# Patient Record
Sex: Female | Born: 1980 | Race: White | Hispanic: No | Marital: Married | Smoking: Never smoker
Health system: Southern US, Community
[De-identification: ages and names within clinical notes are randomized; demographics above are authoritative.]

## PROBLEM LIST (undated history)

## (undated) DIAGNOSIS — F32A Depression, unspecified: Secondary | ICD-10-CM

## (undated) DIAGNOSIS — R12 Heartburn: Secondary | ICD-10-CM

## (undated) DIAGNOSIS — O26899 Other specified pregnancy related conditions, unspecified trimester: Secondary | ICD-10-CM

## (undated) DIAGNOSIS — R51 Headache: Secondary | ICD-10-CM

## (undated) DIAGNOSIS — F329 Major depressive disorder, single episode, unspecified: Secondary | ICD-10-CM

---

## 2003-04-23 ENCOUNTER — Ambulatory Visit (HOSPITAL_COMMUNITY): Admission: RE | Admit: 2003-04-23 | Discharge: 2003-04-23 | Payer: Self-pay | Admitting: Sports Medicine

## 2003-04-23 ENCOUNTER — Encounter: Payer: Self-pay | Admitting: Sports Medicine

## 2004-06-10 ENCOUNTER — Other Ambulatory Visit: Admission: RE | Admit: 2004-06-10 | Discharge: 2004-06-10 | Payer: Self-pay | Admitting: Obstetrics and Gynecology

## 2005-07-30 ENCOUNTER — Other Ambulatory Visit: Admission: RE | Admit: 2005-07-30 | Discharge: 2005-07-30 | Payer: Self-pay | Admitting: Obstetrics and Gynecology

## 2008-04-26 ENCOUNTER — Inpatient Hospital Stay (HOSPITAL_COMMUNITY): Admission: AD | Admit: 2008-04-26 | Discharge: 2008-04-26 | Payer: Self-pay | Admitting: Obstetrics and Gynecology

## 2008-04-29 ENCOUNTER — Inpatient Hospital Stay (HOSPITAL_COMMUNITY): Admission: AD | Admit: 2008-04-29 | Discharge: 2008-04-30 | Payer: Self-pay | Admitting: Obstetrics & Gynecology

## 2008-04-29 ENCOUNTER — Inpatient Hospital Stay (HOSPITAL_COMMUNITY): Admission: AD | Admit: 2008-04-29 | Discharge: 2008-04-29 | Payer: Self-pay | Admitting: Obstetrics & Gynecology

## 2008-05-11 ENCOUNTER — Inpatient Hospital Stay (HOSPITAL_COMMUNITY): Admission: AD | Admit: 2008-05-11 | Discharge: 2008-05-15 | Payer: Self-pay | Admitting: Obstetrics and Gynecology

## 2008-09-05 ENCOUNTER — Encounter: Admission: RE | Admit: 2008-09-05 | Discharge: 2008-09-05 | Payer: Self-pay | Admitting: Obstetrics and Gynecology

## 2011-03-25 NOTE — Op Note (Signed)
NAMEBERYL, Carly Thomas               ACCOUNT NO.:  [id number]   MEDICAL RECORD NO.:  [id number]          PATIENT TYPE:  INP   LOCATION:  9126                          FACILITY:  [facility]   PHYSICIAN:  Kendra H. Tenny Craw, MD     DATE OF BIRTH:  1981-06-22   DATE OF PROCEDURE:  05/12/2008  DATE OF DISCHARGE:                               OPERATIVE REPORT   PREOPERATIVE DIAGNOSES:  1. Intrauterine pregnancy, 38 and 5 week.  2. Failure to descend.   POSTOPERATIVE DIAGNOSES:  1. Intrauterine pregnancy, 38 and 5 week.  2. Failure to descend.   PROCEDURE:  Primary low-transverse cesarean section via Pfannenstiel  skin incision.   SURGEON:  Freddrick March. Tenny Craw, MD   ASSISTANTDrinda Butts, surgical tech.   ANESTHESIA:  Epidural.   OPERATIVE FINDINGS:  Vigorous female infant in the vertex LOA  presentation weighing 8 pounds 2 ounces with Apgar scores of 9 at 1 and  9 at 5 minutes.  Normal-appearing ovaries and tubes.   SPECIMENS:  Placenta to L&D for disposal.   ESTIMATED BLOOD LOSS:  900 mL.   COMPLICATIONS:  None.   INDICATIONS:  Carly Thomas is a 30 year old G1, P0 who was admitted on  May 11, 2008, with spontaneous rupture of membranes.  At that time, she  was approximately fingertip to 1 cm dilated.  She did receive Pitocin  for labor augmentation.  At 6 o'clock this morning, she was found to be  8-9 cm dilated, completely effaced, but 0 to -1 station.  Upon  reevaluation at 9 o'clock, she was still approximately 8-cm dilated and  an intrauterine pressure catheter was placed at this time.  Contractions  were felt to be inadequate and Pitocin was titrated for adequate  contractions.  She made it to complete at approximately 1 o'clock this  afternoon and pushed for approximately an hour and 15 minutes; however,  there was no descent of the fetal head from the 0 to -1 station with an  hour of pushing.  The pubic arch was palpably very narrow and the fetal  head had not descended beneath the  pubic arch and there was concern for  failure of this event of the fetal head secondary to the inadequate  pelvic outlet.  This was discussed with the patient and she was offered  to either continue to push for another hour versus proceeding with  cesarean section.  She elected to proceed with C-section.  Following the  appropriate informed consent.   PROCEDURE:  The patient was brought to the operating room where epidural  anesthesia was found to be adequate.  She was placed in the dorsal  supine position in a leftward tilt, prepped and draped in the normal  sterile fashion.  Scalpel was used to make a Pfannenstiel skin incision,  which was carried down through the underlying layers of soft tissue to  the fascia.  Fascia was incised and the midline fascial incision was  extended laterally with Mayo scissors.  Superior aspect of the fascial  incision was grasped with Kocher clamps x2, tented up the underlying  rectus muscle was dissected off sharply with electrocautery unit.  The  same procedure was repeated on the inferior aspect of the fascial  incision and the rectus muscles were then separated in the midline.  The  abdominal peritoneum was identified, tented up, entered sharply with the  Metzenbaum.  The incision was extended superiorly and inferiorly with  good visualization of the bladder.  Bladder blade was inserted.  Vesicouterine peritoneum was tented up, entered sharply, and the  incision was extended laterally with the Metzenbaum and the bladder flap  was created digitally.  Bladder blade was then reinserted.  Scalpel was  used to make a low-transverse incision on the uterus, which was then  extended with blunt dissection.  Bandage scissors were used to extend  the uterine incision on the left hand aspect of the uterus.  The fetal  vertex was identified deep in the pelvis, brought up to the uterine  incision and the infant's head was easily delivered followed by the  body.   Infant cried vigorously on the operative field.  Cord was clamped  and cut.  The infant was passed to the waiting pediatricians.  Placenta  was then manually removed.  The uterus was exteriorized, cleared of all  clot and debris.  Uterine incision was repaired with #1 chromic in a  running locked fashion.  A second imbricating layer was performed.  Several additional figure-of-eight sutures were required to achieve  complete hemostasis of the uterine incision.  Ovaries and tubes were  inspected and found to be normal.  The uterus was then returned to the  abdominal cavity.  The abdominal cavity was cleared of all clot and  debris.  Uterine incision was reinspected and again confirmed to be  hemostatic.  The abdominal peritoneum was then reapproximated with 2-0  Vicryl and the rectus muscles were reapproximated with 2 figure-of-eight  sutures of #1 chromic and the fascia was closed with a 0 looped PDS and  skin was closed with staples.  All sponge, lap, needle counts were  correct x2.  The patient tolerated the procedure well and was brought to  the recovery room in stable condition following the procedure.      Freddrick March. Tenny Craw, MD  Electronically Signed     KHR/MEDQ  D:  05/12/2008  T:  05/13/2008  Job:  [id number]

## 2011-03-25 NOTE — Discharge Summary (Signed)
Carly Thomas, Carly Thomas               ACCOUNT NO.:  [id number]   MEDICAL RECORD NO.:  [id number]          PATIENT TYPE:  INP   LOCATION:  9126                          FACILITY:  [facility]   PHYSICIAN:  Gerrit Friends. Aldona Bar, M.D.   DATE OF BIRTH:  1981-06-29   DATE OF ADMISSION:  05/11/2008  DATE OF DISCHARGE:  05/15/2008                               DISCHARGE SUMMARY   DISCHARGE DIAGNOSES:  1. Term pregnancy, delivered 8-pound 2-ounce female infant, Apgars 9      and 9.  2. Blood type O positive.  3. Failure to progress.   PROCEDURE:  Primary low-transverse cesarean section.   SUMMARY:  This 30 year old gravida 2, para 0 was admitted in labor on  the evening of July 2.  She was followed expectantly, requested and  received an epidural, and during the day on July 3 was noted to have  failure of descent in the second stage of labor and was taken to the  operating room where she underwent a primary low-transverse cesarean  section with delivery of an 8-pound 2-ounce female infant with Apgars of  9 and 9.  Postoperative course was benign.  She was breast-feeding.  Discharge hemoglobin 8.8 with a white count of 14,700 and the platelet  count of 126,000.  On the morning of July 6, she was afebrile, having  normal bowel and bladder function.  Her wound was clean and dry.  She  was breast-feeding without difficulty.  Vital signs were stable, and she  was desirous of discharge.  Accordingly, her staples were removed and  wound was Steri-Stripped with Benzoin.  She was given all appropriate  instructions per discharge, brochure, and understood all instructions  well.   DISCHARGE MEDICATIONS:  1. Vitamins - 1 a day.  2. Feosol capsules - 1 daily.  3. Motrin 600 mg every 6 hours as needed for cramping or pain.  4. Tylox 1-2 for 4-6 hours as needed for more severe pain.   FOLLOWUP:  She will return to the office for followup in approximately 4  weeks' time or as needed.   CONDITION ON DISCHARGE:   Improves.      Gerrit Friends. Aldona Bar, M.D.  Electronically Signed     RMW/MEDQ  D:  05/15/2008  T:  05/15/2008  Job:  [id number]

## 2011-08-07 LAB — CBC
HCT: 25.4 — ABNORMAL LOW
Hemoglobin: 8.8 — ABNORMAL LOW
MCHC: 34.6
MCV: 92.3
MCV: 93
Platelets: 190
RBC: 2.73 — ABNORMAL LOW
WBC: 10.9 — ABNORMAL HIGH

## 2011-08-07 LAB — URINALYSIS, ROUTINE W REFLEX MICROSCOPIC
Bilirubin Urine: NEGATIVE
Glucose, UA: NEGATIVE
Hgb urine dipstick: NEGATIVE
Ketones, ur: NEGATIVE
Urobilinogen, UA: 0.2

## 2011-08-07 LAB — RPR: RPR Ser Ql: NONREACTIVE

## 2011-09-16 ENCOUNTER — Other Ambulatory Visit: Payer: Self-pay

## 2011-09-16 LAB — OB RESULTS CONSOLE HIV ANTIBODY (ROUTINE TESTING): HIV: NONREACTIVE

## 2011-09-16 LAB — OB RESULTS CONSOLE ANTIBODY SCREEN: Antibody Screen: NEGATIVE

## 2011-09-16 LAB — OB RESULTS CONSOLE RUBELLA ANTIBODY, IGM: Rubella: IMMUNE

## 2011-09-16 LAB — OB RESULTS CONSOLE ABO/RH: RH Type: POSITIVE

## 2011-09-22 ENCOUNTER — Other Ambulatory Visit (HOSPITAL_COMMUNITY): Payer: Self-pay | Admitting: Obstetrics & Gynecology

## 2011-09-22 DIAGNOSIS — O289 Unspecified abnormal findings on antenatal screening of mother: Secondary | ICD-10-CM

## 2011-09-25 ENCOUNTER — Encounter (HOSPITAL_COMMUNITY): Payer: Self-pay

## 2011-09-25 ENCOUNTER — Ambulatory Visit (HOSPITAL_COMMUNITY): Payer: Self-pay

## 2011-09-26 ENCOUNTER — Ambulatory Visit (HOSPITAL_COMMUNITY)
Admission: RE | Admit: 2011-09-26 | Discharge: 2011-09-26 | Disposition: A | Discharge status: Home / Self Care | Payer: BC Managed Care – PPO | Source: Ambulatory Visit | Attending: Obstetrics & Gynecology | Admitting: Obstetrics & Gynecology

## 2011-09-26 ENCOUNTER — Encounter (HOSPITAL_COMMUNITY): Payer: Self-pay

## 2011-09-26 ENCOUNTER — Other Ambulatory Visit (HOSPITAL_COMMUNITY): Payer: Self-pay | Admitting: Obstetrics & Gynecology

## 2011-09-26 VITALS — BP 114/48 | HR 77 | Wt 155.0 lb

## 2011-09-26 DIAGNOSIS — O3510X0 Maternal care for (suspected) chromosomal abnormality in fetus, unspecified, not applicable or unspecified: Secondary | ICD-10-CM | POA: Insufficient documentation

## 2011-09-26 DIAGNOSIS — O289 Unspecified abnormal findings on antenatal screening of mother: Secondary | ICD-10-CM | POA: Insufficient documentation

## 2011-09-26 DIAGNOSIS — O351XX0 Maternal care for (suspected) chromosomal abnormality in fetus, not applicable or unspecified: Secondary | ICD-10-CM | POA: Insufficient documentation

## 2011-09-26 DIAGNOSIS — Z3689 Encounter for other specified antenatal screening: Secondary | ICD-10-CM | POA: Insufficient documentation

## 2011-09-26 NOTE — Progress Notes (Signed)
Carly Thomas was seen for ultrasound appointment today.  Please see [facility] report for details.

## 2011-09-29 ENCOUNTER — Encounter (HOSPITAL_COMMUNITY): Payer: Self-pay

## 2011-09-29 DIAGNOSIS — O289 Unspecified abnormal findings on antenatal screening of mother: Secondary | ICD-10-CM | POA: Insufficient documentation

## 2011-09-29 DIAGNOSIS — Z141 Cystic fibrosis carrier: Secondary | ICD-10-CM | POA: Insufficient documentation

## 2011-09-29 NOTE — Progress Notes (Signed)
Genetic Counseling  High-Risk Gestation Note  Appointment Date:  09/26/2011 Referred By: Carly Beaver, MD Date of Birth:  1981/06/09 Partner:  Carly Thomas  Pregnancy History: [id number] Estimated Date of Delivery: 03/26/12 Estimated Gestational Age: [redacted]w[redacted]d Attending: Berna Spare, MD  [name] and her husband, [name], were seen for genetic counseling because of an increased risk for fetal Down syndrome based on First Trimester screen performed through [facility].  They were counseled regarding the First trimester screen and the associated 1 in 106 risk for fetal Down syndrome.  We reviewed chromosomes, nondisjunction, and the common features and variable prognosis of Down syndrome.  In addition, we reviewed the screen adjusted reduction in risk for trisomy 40 (1 in 1267).  We also discussed other explanations for a screen positive result including: differences in maternal metabolism, and normal variation.  We reviewed other available screening and diagnostic options including detailed ultrasound and amniocentesis. We discussed the risks, limitations, and benefits of each. We discussed another type of screening test which utilizes fetal DNA which is found in the maternal circulation. This test is not diagnostic for chromosome conditions, but can provide information regarding the presence or absence of extra fetal DNA for chromosomes 13, 18 and 21. The reported detection rate for Trisomy 21, and Trisomy 18 is greater than 99%, and is approximately 91% for Trisomy 13. The false positive rate is thought to be less than 1% for any of these conditions. After consideration of all the options, and a clear understanding of the newness and limitations of the cell free DNA testing, they elected to proceed with cell free DNA testing. Those results will be available in 8-10 days and will be forwarded to your office when we receive them. [name] will consider the option of amniocentesis  pending the results of this screening test. The couple also elected to return on 10/29/11 for detailed ultrasound. They understand that ultrasound cannot rule out all birth defects or genetic syndromes.  However, they were counseled that 50-80% of fetuses with Down syndrome, when well visualized, have detectable anomalies or soft markers by ultrasound.  A complete ultrasound was performed today.  The ultrasound report will be sent under separate cover.  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 [name] reportedly has had negative CF carrier screening. We currently do not have medical records to confirm this report. We discussed cystic fibrosis (CF) including: the features of CF, the incidence in the Caucasian population, autosomal recessive inheritance, and the 25% chance of having a baby with CF if both parents are carriers of CF.  We reviewed that carrier screening does not identify all carriers. A negative CF screen for [name] reduces but does not eliminate his chance to be a carrier. Prenatal diagnosis for CF is most informative when mutations have been in both parents first. We reviewed that CF is included on the newborn screening panel in .   [name] reported a nephew with DiGeorge syndrome who passed away at age 57 months. He reported that his brother (his nephew's father) had follow-up testing which indicated that he did not carry the chromosome difference. However, he was described as having "backwards chromosomes." Approximately 93% of individuals with DiGeorge syndrome have a de novo (occurring for the first time in that individual) deletion of the 22q11.2 region, and approximately 7% of individuals inherited the deletion from one parent who also carries  the deletion of the 22q11.2 region. DiGeorge syndrome is an autosomal dominant condition, meaning once a person has the condition they have a 50% (1 in 2)  chance with each pregnancy to pass on the chromosome 22 that has the deletion and also have a child with the condition. It is unclear if [name] brother had chromosome analysis that revealed a chromosome rearrangement that may increase recurrence risk for DiGeorge or not. If so, then chromosome analysis would be available to relatives, such as [name] to assess for an underlying chromosome rearrangement in order to assess recurrence risk. However, in the case that DiGeorge occurred sporadically, recurrence risk for the current pregnancy would not be expected to be increased. We are available to review medical records, if desired, regarding the result of chromosome testing performed in order to more accurately assess recurrence risk for relatives.   Additionally, the patient reported a maternal aunt with open spina bifida, which was surgically corrected at birth. She is currently 32 years old. No additional relatives were reported with spina bifida, including this relative's children. We discussed that spina bifida is a common birth differences and affects approximately 1 in 1000 live births. NTDs occur as an isolated finding, in the majority of cases.  Isolated NTDs are usually inherited in a multifactorial manner in which there is no prior family history.  Multifactorial conditions have both environmental and genetic factors that contribute to their development.  We also discussed that NTDs may occur as a feature of an underlying genetic syndrome or condition.  Approximately 5-10% of individuals who have spina bifida also have an underlying chromosome condition.  We reviewed chromosomes, genes, and various inheritance patterns.  Without additional information, it is difficult to determine a specific etiology.  If the relative had a nonsyndromic, multifactorial NTD, the risk of recurrence for the current pregnancy (a third degree relative) is estimated to be 0.5%.  We reviewed available screening and  testing options including targeted ultrasound, MSAFP, and amniocentesis. Additional information regarding etiology for this relative may alter recurrence risk assessment. Further genetic counseling is warranted if more information is obtained.  [name] also reported a maternal aunt with mild mental retardation. An underlying cause is not known. We discussed that there are many different causes of mental retardation such as genetic differences, sporadic causes, or injuries.  A specific diagnosis for mental retardation can be determined in approximately 50% of cases.  In the remaining 50% of cases, a diagnosis may not ever be determined.  Without more specific information, potential genetic risks cannot be determined.  The patient may call if she is able to find out more information regarding a specific diagnosis.      [name] denied exposure to environmental toxins or chemical agents. She denied the use of alcohol, tobacco or street drugs. She denied significant viral illnesses during the course of her pregnancy. Her medical and surgical histories were noncontributory.   I counseled this couple for approximately 40 minutes regarding the above risks and available options.     Carly Braun Zora Glendenning, MS, [facility] 09/29/2011

## 2011-09-30 ENCOUNTER — Other Ambulatory Visit: Payer: Self-pay

## 2011-10-07 ENCOUNTER — Telehealth (HOSPITAL_COMMUNITY): Payer: Self-pay | Admitting: MS"

## 2011-10-07 NOTE — Telephone Encounter (Signed)
  Results of cell free fetal DNA (cffDNA) testing through [facility]) indicate a low risk (less than 1 in 10,000 risk) for Trisomy 21, Trisomy 18, and Trisomy 13. These results were communicated to [name]. The reported detection rate for Trisomy 21, and Trisomy 18 is greater than 99%, and is approximately 91% for Trisomy 13. The false positive rate is thought to be less than 1% for any of these conditions. The patient understands that this test is not diagnostic. She is happy to hear these results. Targeted ultrasound is scheduled for 10/29/11.

## 2011-10-10 ENCOUNTER — Other Ambulatory Visit: Payer: Self-pay

## 2011-10-29 ENCOUNTER — Other Ambulatory Visit (HOSPITAL_COMMUNITY): Payer: Self-pay | Admitting: Obstetrics & Gynecology

## 2011-10-29 ENCOUNTER — Ambulatory Visit (HOSPITAL_COMMUNITY)
Admission: RE | Admit: 2011-10-29 | Discharge: 2011-10-29 | Disposition: A | Discharge status: Home / Self Care | Payer: BC Managed Care – PPO | Source: Ambulatory Visit | Attending: Obstetrics & Gynecology | Admitting: Obstetrics & Gynecology

## 2011-10-29 DIAGNOSIS — O351XX0 Maternal care for (suspected) chromosomal abnormality in fetus, not applicable or unspecified: Secondary | ICD-10-CM | POA: Insufficient documentation

## 2011-10-29 DIAGNOSIS — O3510X0 Maternal care for (suspected) chromosomal abnormality in fetus, unspecified, not applicable or unspecified: Secondary | ICD-10-CM | POA: Insufficient documentation

## 2011-10-29 DIAGNOSIS — O352XX0 Maternal care for (suspected) hereditary disease in fetus, not applicable or unspecified: Secondary | ICD-10-CM

## 2011-10-29 DIAGNOSIS — O289 Unspecified abnormal findings on antenatal screening of mother: Secondary | ICD-10-CM

## 2011-10-29 NOTE — Progress Notes (Signed)
Ms. Tolan was seen for ultrasound appointment today.  Please see [facility] report for details.  

## 2011-12-03 ENCOUNTER — Ambulatory Visit (HOSPITAL_COMMUNITY): Payer: BC Managed Care – PPO

## 2012-02-27 ENCOUNTER — Encounter (HOSPITAL_COMMUNITY): Payer: Self-pay

## 2012-03-12 ENCOUNTER — Encounter (HOSPITAL_COMMUNITY)
Admission: RE | Admit: 2012-03-12 | Discharge: 2012-03-12 | Disposition: A | Discharge status: Home / Self Care | Payer: BC Managed Care – PPO | Source: Ambulatory Visit | Attending: Obstetrics and Gynecology | Admitting: Obstetrics and Gynecology

## 2012-03-12 ENCOUNTER — Encounter (HOSPITAL_COMMUNITY): Payer: Self-pay

## 2012-03-12 HISTORY — DX: Headache: R51

## 2012-03-12 HISTORY — DX: Other specified pregnancy related conditions, unspecified trimester: O26.899

## 2012-03-12 HISTORY — DX: Heartburn: R12

## 2012-03-12 HISTORY — DX: Depression, unspecified: F32.A

## 2012-03-12 HISTORY — DX: Major depressive disorder, single episode, unspecified: F32.9

## 2012-03-12 LAB — SURGICAL PCR SCREEN: MRSA, PCR: NEGATIVE

## 2012-03-12 LAB — CBC
HCT: 35.1 % — ABNORMAL LOW (ref 36.0–46.0)
Hemoglobin: 11.6 g/dL — ABNORMAL LOW (ref 12.0–15.0)
MCH: 31 pg (ref 26.0–34.0)
MCHC: 33 g/dL (ref 30.0–36.0)
MCV: 93.9 fL (ref 78.0–100.0)

## 2012-03-12 NOTE — Patient Instructions (Signed)
YOUR PROCEDURE IS SCHEDULED ON:03/19/12  ENTER THROUGH THE MAIN ENTRANCE OF [facility] AT:1030am  USE DESK PHONE AND DIAL [phone] TO INFORM us OF YOUR ARRIVAL  CALL [phone] IF YOU HAVE ANY QUESTIONS OR PROBLEMS PRIOR TO YOUR ARRIVAL.  REMEMBER: DO NOT EAT AFTER MIDNIGHT :Thursday  SPECIAL INSTRUCTIONS:Clear liquids until 8am Fri   YOU MAY BRUSH YOUR TEETH THE MORNING OF SURGERY   TAKE THESE MEDICINES THE DAY OF SURGERY WITH SIP OF WATER:none   DO NOT WEAR JEWELRY, EYE MAKEUP, LIPSTICK OR DARK FINGERNAIL POLISH DO NOT WEAR LOTIONS  DO NOT SHAVE FOR 48 HOURS PRIOR TO SURGERY  YOU WILL NOT BE ALLOWED TO DRIVE YOURSELF HOME.  NAME OF DRIVER:Nick Noralyn Pick

## 2012-03-15 ENCOUNTER — Encounter (HOSPITAL_COMMUNITY): Payer: Self-pay

## 2012-03-18 MED ORDER — CEFAZOLIN SODIUM-DEXTROSE 2-3 GM-% IV SOLR
2.0000 g | INTRAVENOUS | Status: AC
  Administered 2012-03-19: 2 g via INTRAVENOUS
  Filled 2012-03-18: qty 50

## 2012-03-19 ENCOUNTER — Encounter (HOSPITAL_COMMUNITY): Payer: Self-pay | Admitting: *Deleted

## 2012-03-19 ENCOUNTER — Inpatient Hospital Stay (HOSPITAL_COMMUNITY)
Admission: RE | Admit: 2012-03-19 | Discharge: 2012-03-21 | DRG: 371 | Disposition: A | Discharge status: Home / Self Care | Payer: BC Managed Care – PPO | Source: Ambulatory Visit | Attending: Obstetrics and Gynecology | Admitting: Obstetrics and Gynecology

## 2012-03-19 ENCOUNTER — Encounter (HOSPITAL_COMMUNITY)
Admission: RE | Disposition: A | Discharge status: Home / Self Care | Payer: Self-pay | Source: Ambulatory Visit | Attending: Obstetrics and Gynecology

## 2012-03-19 ENCOUNTER — Encounter (HOSPITAL_COMMUNITY): Payer: Self-pay

## 2012-03-19 ENCOUNTER — Inpatient Hospital Stay (HOSPITAL_COMMUNITY): Payer: BC Managed Care – PPO

## 2012-03-19 ENCOUNTER — Encounter (HOSPITAL_COMMUNITY): Payer: Self-pay | Admitting: Registered Nurse

## 2012-03-19 DIAGNOSIS — Z141 Cystic fibrosis carrier: Secondary | ICD-10-CM

## 2012-03-19 DIAGNOSIS — O289 Unspecified abnormal findings on antenatal screening of mother: Secondary | ICD-10-CM

## 2012-03-19 DIAGNOSIS — O34219 Maternal care for unspecified type scar from previous cesarean delivery: Principal | ICD-10-CM | POA: Diagnosis present

## 2012-03-19 DIAGNOSIS — Z3480 Encounter for supervision of other normal pregnancy, unspecified trimester: Secondary | ICD-10-CM

## 2012-03-19 SURGERY — Surgical Case
Anesthesia: Spinal | Wound class: Clean Contaminated

## 2012-03-19 MED ORDER — ONDANSETRON HCL 4 MG/2ML IJ SOLN
INTRAMUSCULAR | Status: AC
  Filled 2012-03-19: qty 2

## 2012-03-19 MED ORDER — NALBUPHINE HCL 10 MG/ML IJ SOLN
5.0000 mg | INTRAMUSCULAR | Status: DC | PRN
  Filled 2012-03-19: qty 1

## 2012-03-19 MED ORDER — MEPERIDINE HCL 25 MG/ML IJ SOLN: 6.2500 mg | INTRAMUSCULAR | Status: DC | PRN

## 2012-03-19 MED ORDER — HYDROMORPHONE BOLUS VIA INFUSION: 1.0000 mg | Freq: Once | INTRAVENOUS | Status: DC

## 2012-03-19 MED ORDER — MORPHINE SULFATE (PF) 0.5 MG/ML IJ SOLN
INTRAMUSCULAR | Status: DC | PRN
  Administered 2012-03-19: .1 mg via INTRATHECAL

## 2012-03-19 MED ORDER — PHENYLEPHRINE HCL 10 MG/ML IJ SOLN
INTRAMUSCULAR | Status: DC | PRN
  Administered 2012-03-19: 80 ug via INTRAVENOUS
  Administered 2012-03-19: 40 ug via INTRAVENOUS

## 2012-03-19 MED ORDER — MORPHINE SULFATE 0.5 MG/ML IJ SOLN
INTRAMUSCULAR | Status: AC
  Filled 2012-03-19: qty 10

## 2012-03-19 MED ORDER — LANOLIN HYDROUS EX OINT: 1.0000 "application " | TOPICAL_OINTMENT | CUTANEOUS | Status: DC | PRN

## 2012-03-19 MED ORDER — MIDAZOLAM HCL 2 MG/2ML IJ SOLN: 0.5000 mg | Freq: Once | INTRAMUSCULAR | Status: DC | PRN

## 2012-03-19 MED ORDER — SCOPOLAMINE 1 MG/3DAYS TD PT72
MEDICATED_PATCH | TRANSDERMAL | Status: AC
  Administered 2012-03-19: 1.5 mg via TRANSDERMAL
  Filled 2012-03-19: qty 1

## 2012-03-19 MED ORDER — KETOROLAC TROMETHAMINE 30 MG/ML IJ SOLN
INTRAMUSCULAR | Status: AC
  Administered 2012-03-19: 30 mg via INTRAMUSCULAR
  Filled 2012-03-19: qty 1

## 2012-03-19 MED ORDER — ACETAMINOPHEN 10 MG/ML IV SOLN
1000.0000 mg | Freq: Four times a day (QID) | INTRAVENOUS | Status: AC | PRN
  Filled 2012-03-19: qty 100

## 2012-03-19 MED ORDER — SIMETHICONE 80 MG PO CHEW: 80.0000 mg | CHEWABLE_TABLET | ORAL | Status: DC | PRN

## 2012-03-19 MED ORDER — DIPHENHYDRAMINE HCL 25 MG PO CAPS
25.0000 mg | ORAL_CAPSULE | ORAL | Status: DC | PRN
  Filled 2012-03-19 (×3): qty 1

## 2012-03-19 MED ORDER — SIMETHICONE 80 MG PO CHEW
80.0000 mg | CHEWABLE_TABLET | Freq: Three times a day (TID) | ORAL | Status: DC
  Administered 2012-03-19 – 2012-03-21 (×6): 80 mg via ORAL

## 2012-03-19 MED ORDER — KETOROLAC TROMETHAMINE 30 MG/ML IJ SOLN: 30.0000 mg | Freq: Four times a day (QID) | INTRAMUSCULAR | Status: AC | PRN

## 2012-03-19 MED ORDER — FENTANYL CITRATE 0.05 MG/ML IJ SOLN
INTRAMUSCULAR | Status: AC
  Filled 2012-03-19: qty 2

## 2012-03-19 MED ORDER — IBUPROFEN 600 MG PO TABS
600.0000 mg | ORAL_TABLET | Freq: Four times a day (QID) | ORAL | Status: DC
  Administered 2012-03-20 – 2012-03-21 (×6): 600 mg via ORAL
  Filled 2012-03-19 (×6): qty 1

## 2012-03-19 MED ORDER — TETANUS-DIPHTH-ACELL PERTUSSIS 5-2.5-18.5 LF-MCG/0.5 IM SUSP
0.5000 mL | Freq: Once | INTRAMUSCULAR | Status: AC
  Administered 2012-03-20: 0.5 mL via INTRAMUSCULAR
  Filled 2012-03-19: qty 0.5

## 2012-03-19 MED ORDER — FENTANYL CITRATE 0.05 MG/ML IJ SOLN: 25.0000 ug | INTRAMUSCULAR | Status: DC | PRN

## 2012-03-19 MED ORDER — ONDANSETRON HCL 4 MG/2ML IJ SOLN: 4.0000 mg | Freq: Three times a day (TID) | INTRAMUSCULAR | Status: DC | PRN

## 2012-03-19 MED ORDER — OXYTOCIN 10 UNIT/ML IJ SOLN
INTRAMUSCULAR | Status: DC | PRN
  Administered 2012-03-19: 20 [IU]

## 2012-03-19 MED ORDER — 0.9 % SODIUM CHLORIDE (POUR BTL) OPTIME
TOPICAL | Status: DC | PRN
  Administered 2012-03-19: 1000 mL

## 2012-03-19 MED ORDER — METOCLOPRAMIDE HCL 5 MG/ML IJ SOLN: 10.0000 mg | Freq: Three times a day (TID) | INTRAMUSCULAR | Status: DC | PRN

## 2012-03-19 MED ORDER — DIBUCAINE 1 % RE OINT: 1.0000 "application " | TOPICAL_OINTMENT | RECTAL | Status: DC | PRN

## 2012-03-19 MED ORDER — HYDROMORPHONE HCL PF 1 MG/ML IJ SOLN
1.0000 mg | Freq: Once | INTRAMUSCULAR | Status: AC
  Administered 2012-03-19: 1 mg via INTRAVENOUS
  Filled 2012-03-19: qty 1

## 2012-03-19 MED ORDER — KETOROLAC TROMETHAMINE 30 MG/ML IJ SOLN
30.0000 mg | Freq: Four times a day (QID) | INTRAMUSCULAR | Status: AC | PRN
  Administered 2012-03-19: 30 mg via INTRAMUSCULAR
  Filled 2012-03-19: qty 1

## 2012-03-19 MED ORDER — NALOXONE HCL 0.4 MG/ML IJ SOLN: 0.4000 mg | INTRAMUSCULAR | Status: DC | PRN

## 2012-03-19 MED ORDER — DIPHENHYDRAMINE HCL 25 MG PO CAPS
25.0000 mg | ORAL_CAPSULE | Freq: Four times a day (QID) | ORAL | Status: DC | PRN
  Administered 2012-03-20 (×3): 25 mg via ORAL
  Filled 2012-03-19: qty 1

## 2012-03-19 MED ORDER — ONDANSETRON HCL 4 MG/2ML IJ SOLN: 4.0000 mg | INTRAMUSCULAR | Status: DC | PRN

## 2012-03-19 MED ORDER — DIPHENHYDRAMINE HCL 50 MG/ML IJ SOLN: 12.5000 mg | INTRAMUSCULAR | Status: DC | PRN

## 2012-03-19 MED ORDER — SODIUM CHLORIDE 0.9 % IV SOLN
1.0000 ug/kg/h | INTRAVENOUS | Status: DC | PRN
  Filled 2012-03-19: qty 2.5

## 2012-03-19 MED ORDER — MENTHOL 3 MG MT LOZG: 1.0000 | LOZENGE | OROMUCOSAL | Status: DC | PRN

## 2012-03-19 MED ORDER — OXYTOCIN 10 UNIT/ML IJ SOLN
INTRAMUSCULAR | Status: AC
  Filled 2012-03-19: qty 2

## 2012-03-19 MED ORDER — KETOROLAC TROMETHAMINE 30 MG/ML IJ SOLN
30.0000 mg | Freq: Once | INTRAMUSCULAR | Status: AC
  Administered 2012-03-19: 30 mg via INTRAVENOUS

## 2012-03-19 MED ORDER — BUPIVACAINE IN DEXTROSE 0.75-8.25 % IT SOLN
INTRATHECAL | Status: DC | PRN
  Administered 2012-03-19: 1.5 mL via INTRATHECAL

## 2012-03-19 MED ORDER — ONDANSETRON HCL 4 MG PO TABS: 4.0000 mg | ORAL_TABLET | ORAL | Status: DC | PRN

## 2012-03-19 MED ORDER — DIPHENHYDRAMINE HCL 50 MG/ML IJ SOLN: 25.0000 mg | INTRAMUSCULAR | Status: DC | PRN

## 2012-03-19 MED ORDER — LACTATED RINGERS IV SOLN
INTRAVENOUS | Status: DC
  Administered 2012-03-19 (×3): via INTRAVENOUS

## 2012-03-19 MED ORDER — WITCH HAZEL-GLYCERIN EX PADS: 1.0000 "application " | MEDICATED_PAD | CUTANEOUS | Status: DC | PRN

## 2012-03-19 MED ORDER — OXYTOCIN 20 UNITS IN LACTATED RINGERS INFUSION - SIMPLE
INTRAVENOUS | Status: AC
  Administered 2012-03-19: 125 mL/h via INTRAVENOUS
  Filled 2012-03-19: qty 1000

## 2012-03-19 MED ORDER — SCOPOLAMINE 1 MG/3DAYS TD PT72
1.0000 | MEDICATED_PATCH | Freq: Once | TRANSDERMAL | Status: DC
  Administered 2012-03-19: 1.5 mg via TRANSDERMAL

## 2012-03-19 MED ORDER — ONDANSETRON HCL 4 MG/2ML IJ SOLN
INTRAMUSCULAR | Status: DC | PRN
  Administered 2012-03-19: 4 mg via INTRAVENOUS

## 2012-03-19 MED ORDER — SENNOSIDES-DOCUSATE SODIUM 8.6-50 MG PO TABS
2.0000 | ORAL_TABLET | Freq: Every day | ORAL | Status: DC
  Administered 2012-03-19 – 2012-03-20 (×2): 2 via ORAL

## 2012-03-19 MED ORDER — PHENYLEPHRINE 40 MCG/ML (10ML) SYRINGE FOR IV PUSH (FOR BLOOD PRESSURE SUPPORT)
PREFILLED_SYRINGE | INTRAVENOUS | Status: AC
  Filled 2012-03-19: qty 5

## 2012-03-19 MED ORDER — LACTATED RINGERS IV SOLN: INTRAVENOUS | Status: DC

## 2012-03-19 MED ORDER — PRENATAL MULTIVITAMIN CH
1.0000 | ORAL_TABLET | Freq: Every day | ORAL | Status: DC
  Administered 2012-03-20 – 2012-03-21 (×2): 1 via ORAL
  Filled 2012-03-19 (×2): qty 1

## 2012-03-19 MED ORDER — OXYTOCIN 20 UNITS IN LACTATED RINGERS INFUSION - SIMPLE
125.0000 mL/h | INTRAVENOUS | Status: AC
  Administered 2012-03-19: 125 mL/h via INTRAVENOUS

## 2012-03-19 MED ORDER — PROMETHAZINE HCL 25 MG/ML IJ SOLN: 6.2500 mg | INTRAMUSCULAR | Status: DC | PRN

## 2012-03-19 MED ORDER — OXYCODONE-ACETAMINOPHEN 5-325 MG PO TABS
1.0000 | ORAL_TABLET | ORAL | Status: DC | PRN
  Administered 2012-03-20 (×8): 1 via ORAL
  Administered 2012-03-21 (×2): 2 via ORAL
  Filled 2012-03-19: qty 2
  Filled 2012-03-19 (×6): qty 1
  Filled 2012-03-19: qty 2
  Filled 2012-03-19 (×2): qty 1

## 2012-03-19 MED ORDER — ACETAMINOPHEN 325 MG PO TABS: 325.0000 mg | ORAL_TABLET | ORAL | Status: DC | PRN

## 2012-03-19 MED ORDER — ZOLPIDEM TARTRATE 5 MG PO TABS: 5.0000 mg | ORAL_TABLET | Freq: Every evening | ORAL | Status: DC | PRN

## 2012-03-19 MED ORDER — EPHEDRINE 5 MG/ML INJ
INTRAVENOUS | Status: AC
  Filled 2012-03-19: qty 10

## 2012-03-19 MED ORDER — EPHEDRINE SULFATE 50 MG/ML IJ SOLN
INTRAMUSCULAR | Status: DC | PRN
  Administered 2012-03-19: 5 mg via INTRAVENOUS
  Administered 2012-03-19 (×2): 10 mg via INTRAVENOUS

## 2012-03-19 MED ORDER — SODIUM CHLORIDE 0.9 % IJ SOLN: 3.0000 mL | INTRAMUSCULAR | Status: DC | PRN

## 2012-03-19 MED ORDER — FENTANYL CITRATE 0.05 MG/ML IJ SOLN
INTRAMUSCULAR | Status: DC | PRN
  Administered 2012-03-19: 25 ug via INTRATHECAL

## 2012-03-19 MED ORDER — SCOPOLAMINE 1 MG/3DAYS TD PT72: 1.0000 | MEDICATED_PATCH | Freq: Once | TRANSDERMAL | Status: DC

## 2012-03-19 SURGICAL SUPPLY — 35 items
ADH SKN CLS APL DERMABOND .7 (GAUZE/BANDAGES/DRESSINGS) ×2
CLOTH BEACON ORANGE TIMEOUT ST (SAFETY) ×2 IMPLANT
DERMABOND ADVANCED (GAUZE/BANDAGES/DRESSINGS) ×2
DERMABOND ADVANCED .7 DNX12 (GAUZE/BANDAGES/DRESSINGS) IMPLANT
DRESSING TELFA 8X3 (GAUZE/BANDAGES/DRESSINGS) ×2 IMPLANT
ELECT REM PT RETURN 9FT ADLT (ELECTROSURGICAL) ×2
ELECTRODE REM PT RTRN 9FT ADLT (ELECTROSURGICAL) ×1 IMPLANT
EXTRACTOR VACUUM M CUP 4 TUBE (SUCTIONS) ×1 IMPLANT
GAUZE SPONGE 4X4 12PLY STRL LF (GAUZE/BANDAGES/DRESSINGS) ×4 IMPLANT
GLOVE BIO SURGEON STRL SZ7 (GLOVE) ×4 IMPLANT
GLOVE BIOGEL PI IND STRL 6.5 (GLOVE) IMPLANT
GLOVE BIOGEL PI IND STRL 7.0 (GLOVE) IMPLANT
GLOVE BIOGEL PI INDICATOR 6.5 (GLOVE) ×1
GLOVE BIOGEL PI INDICATOR 7.0 (GLOVE) ×1
GLOVE ECLIPSE 6.0 STRL STRAW (GLOVE) ×1 IMPLANT
GLOVE ECLIPSE 6.5 STRL STRAW (GLOVE) ×1 IMPLANT
GOWN PREVENTION PLUS LG XLONG (DISPOSABLE) ×5 IMPLANT
KIT ABG SYR 3ML LUER SLIP (SYRINGE) IMPLANT
NDL HYPO 25X5/8 SAFETYGLIDE (NEEDLE) IMPLANT
NEEDLE HYPO 25X5/8 SAFETYGLIDE (NEEDLE) IMPLANT
NS IRRIG 1000ML POUR BTL (IV SOLUTION) ×2 IMPLANT
PACK C SECTION WH (CUSTOM PROCEDURE TRAY) ×2 IMPLANT
PAD ABD 7.5X8 STRL (GAUZE/BANDAGES/DRESSINGS) ×2 IMPLANT
RTRCTR C-SECT PINK 25CM LRG (MISCELLANEOUS) ×1 IMPLANT
RTRCTR C-SECT PINK 34CM XLRG (MISCELLANEOUS) IMPLANT
SLEEVE SCD COMPRESS KNEE MED (MISCELLANEOUS) IMPLANT
STAPLER VISISTAT 35W (STAPLE) IMPLANT
SUT CHROMIC 1 CTX 36 (SUTURE) ×4 IMPLANT
SUT PDS AB 0 CTX 60 (SUTURE) ×2 IMPLANT
SUT VIC AB 2-0 CT1 27 (SUTURE) ×4
SUT VIC AB 2-0 CT1 TAPERPNT 27 (SUTURE) ×1 IMPLANT
SUT VIC AB 4-0 KS 27 (SUTURE) ×1 IMPLANT
TOWEL OR 17X24 6PK STRL BLUE (TOWEL DISPOSABLE) ×4 IMPLANT
TRAY FOLEY CATH 14FR (SET/KITS/TRAYS/PACK) ×2 IMPLANT
WATER STERILE IRR 1000ML POUR (IV SOLUTION) ×2 IMPLANT

## 2012-03-19 NOTE — Preoperative (Signed)
Beta Blockers   Reason not to administer Beta Blockers:Not Applicable 

## 2012-03-19 NOTE — Op Note (Signed)
Pre-Operative Diagnosis: 1) 39+3 week intrauterine pregnancy 2) History of prior cesarean section, declines trial of labor Postoperative Diagnosis: Same Procedure: Repeat cesarean section via pfannensteil incision Surgeon: Dr. Waynard Reeds Assistant: Dr. Miguel Aschoff Operative Findings: Vigorous female infant in the vertex presentation with apgars of 9 & 9. Normal ovaries, tubes, and uterus.  Arcuate appearing uterus. Specimen: Placenta for disposal EBL: Total I/O In: 2600 [I.V.:2600] Out: 1200 [Urine:200; Blood:1000]   Procedure:Carly Thomas is an 31 year old gravida 3 para 1011 at 63 weeks and 3 days estimated gestational age who presents for cesarean section. Following the appropriate informed consent the patient was brought to the operating room where spinal anesthesia was administered and found to be adequate. She was placed in the dorsal supine position with a leftward tilt. She was prepped and draped in the normal sterile fashion. Scalpel was then used to make a Pfannenstiel skin incision which was carried down to the underlying layers of soft tissue to the fascia. The fascia was incised in the midline and the fascial incision was extended laterally with Mayo scissors. The superior aspect of the fascial incision was grasped with Coker clamps x2, tented up and the rectus muscles dissected off sharply with the electrocautery unit area and the same procedure was repeated on the inferior aspect of the fascial incision. The rectus muscles were separated in the midline. The abdominal peritoneum was identified, tented up, entered sharply, and the incision was extended superiorly and inferiorly with good visualization of the bladder. The a Alexis retractor was then deployed. The vesicouterine peritoneum was identified, tented up, entered sharply, and the bladder flap was created digitally. Scalpel was then used to make a low transverse incision on the uterus which was extended laterally with both blunt  dissection and the bandage scissors. The fetal vertex was identified, delivered easily through the uterine incision followed by the body. The infant was bulb suctioned on the operative field cried vigorously, corpus length and cut and the infant was passed to the waiting neonatologist. Placenta was then delivered spontaneously, the uterus was cleared of all clot and debris. The uterine incision was repaired with #1 chromic in running locked fashion followed by a second imbricating layer. Ovaries and tubes were inspected and normal. The Alexis retractor was removed. The uterus was returned to the abdominal cavity the abdominal cavity was cleared of all clot and debris. The abdominal peritoneum was reapproximated with 2-0 Vicryl in a running fashion, the rectus muscles was reapproximated with 2-0 vicryl in a running fashion. The fascia was closed with a looped PDS in a running fashion. The skin was closed with 4-0 vicryl in a subcuticular fashion and dermabond. All sponge lap and needle counts were correct x2. Patient tolerated the procedure well and recovered in stable condition following the procedure.

## 2012-03-19 NOTE — Anesthesia Preprocedure Evaluation (Signed)
Anesthesia Evaluation  Patient identified by MRN, date of birth, ID band Patient awake    Reviewed: Allergy & Precautions, H&P , NPO status , Patient's Chart, lab work & pertinent test results  Airway Mallampati: III      Dental No notable dental hx.    Pulmonary neg pulmonary ROS,  breath sounds clear to auscultation  Pulmonary exam normal       Cardiovascular Exercise Tolerance: Good negative cardio ROS  Rhythm:regular Rate:Normal     Neuro/Psych negative neurological ROS  negative psych ROS   GI/Hepatic negative GI ROS, Neg liver ROS,   Endo/Other  negative endocrine ROS  Renal/GU negative Renal ROS  negative genitourinary   Musculoskeletal   Abdominal Normal abdominal exam  (+)   Peds  Hematology negative hematology ROS (+)   Anesthesia Other Findings Heartburn in pregnancy     Headache   migraines with 1st pregnancy    Depression   with 1st pregnancy    Reproductive/Obstetrics (+) Pregnancy                           Anesthesia Physical Anesthesia Plan  ASA: II  Anesthesia Plan: Spinal   Post-op Pain Management:    Induction:   Airway Management Planned:   Additional Equipment:   Intra-op Plan:   Post-operative Plan:   Informed Consent: I have reviewed the patients History and Physical, chart, labs and discussed the procedure including the risks, benefits and alternatives for the proposed anesthesia with the patient or authorized representative who has indicated his/her understanding and acceptance.     Plan Discussed with: Anesthesiologist, CRNA and Surgeon  Anesthesia Plan Comments:         Anesthesia Quick Evaluation

## 2012-03-19 NOTE — Transfer of Care (Signed)
Immediate Anesthesia Transfer of Care Note  Patient: Carly Thomas  Procedure(s) Performed: Procedure(s) (LRB): CESAREAN SECTION (N/A)  Patient Location: PACU  Anesthesia Type: Spinal  Level of Consciousness: awake, alert  and oriented  Airway & Oxygen Therapy: Patient Spontanous Breathing  Post-op Assessment: Report given to PACU RN and Post -op Vital signs reviewed and stable  Post vital signs: stable  Complications: No apparent anesthesia complications

## 2012-03-19 NOTE — H&P (Signed)
  CC: Scheduled C/S HPI: 31 yo G3P1011 presents at 39+3 for a scheduled repeat cesarean section.  The patient had her first cesarean for arrest of descent at 38 weeks for a 8#2 ounce baby.  The EFW of this pregnancy is projected to be similar.  The patient had an abnormal first trimester screen  With a DSR of 1 in 106.  She was seen by MFM and had a normal level II ultrasound.  SHe had the Harmony test performed which was normal. PMH: None POB: G3P1011  2002 SAB  2009 38 wk primary c/s female 8#2 PGYN: No abn pap no STDs Meds: PNV All: NKDA SH: - x 3 PNL: see hollister PE: AFVSS AOX3 Gravid soft NT Cvx 1/50/-2 A/P 1) Repeat cesarean section

## 2012-03-19 NOTE — Brief Op Note (Signed)
03/19/2012  1:02 PM  PATIENT:  Carly Thomas  31 y.o. female  PRE-OPERATIVE DIAGNOSIS:  previous cesarean section  POST-OPERATIVE DIAGNOSIS:  previous cesarean section  PROCEDURE:  Procedure(s) (LRB): CESAREAN SECTION (N/A)  SURGEON:  Surgeon(s) and Role:    * Nazia Rhines H. Tenny Craw, MD - Primary    * Miguel Aschoff, MD - Assisting  PHYSICIAN ASSISTANT:   ASSISTANTS: Miguel Aschoff, MD   ANESTHESIA:   spinal  EBL:  Total I/O In: 2600 [I.V.:2600] Out: 1200 [Urine:200; Blood:1000]  BLOOD ADMINISTERED:none  DRAINS: Urinary Catheter (Foley)   LOCAL MEDICATIONS USED:  NONE  SPECIMEN:  Source of Specimen:  Placenta for disposal  DISPOSITION OF SPECIMEN:  Disposal  COUNTS:  YES  TOURNIQUET:  * No tourniquets in log *  DICTATION: .Dragon Dictation  PLAN OF CARE: Admit to inpatient   PATIENT DISPOSITION:  PACU - hemodynamically stable.   Delay start of Pharmacological VTE agent (>24hrs) due to surgical blood loss or risk of bleeding: yes

## 2012-03-19 NOTE — Anesthesia Postprocedure Evaluation (Signed)
  Anesthesia Post-op Note  Patient: Carly Thomas  Procedure(s) Performed: Procedure(s) (LRB): CESAREAN SECTION (N/A)  Patient Location: PACU  Anesthesia Type: Spinal  Level of Consciousness: awake, alert  and oriented  Airway and Oxygen Therapy: Patient Spontanous Breathing  Post-op Pain: none  Post-op Assessment: Post-op Vital signs reviewed, Patient's Cardiovascular Status Stable, Respiratory Function Stable, Patent Airway, No signs of Nausea or vomiting, Pain level controlled, No headache and No backache  Post-op Vital Signs: Reviewed and stable  Complications: No apparent anesthesia complications

## 2012-03-19 NOTE — Anesthesia Procedure Notes (Signed)
Spinal  Patient location during procedure: OR Start time: 03/19/2012 12:07 PM Staffing Anesthesiologist: Brayton Caves R Performed by: anesthesiologist  Preanesthetic Checklist Completed: patient identified, site marked, surgical consent, pre-op evaluation, timeout performed, IV checked, risks and benefits discussed and monitors and equipment checked Spinal Block Patient position: sitting Prep: DuraPrep Patient monitoring: heart rate, cardiac monitor, continuous pulse ox and blood pressure Approach: midline Location: L3-4 Injection technique: single-shot Needle Needle type: Sprotte  Needle gauge: 24 G Needle length: 9 cm Assessment Sensory level: T4 Additional Notes Patient identified.  Risk benefits discussed including failed block, incomplete pain control, headache, nerve damage, paralysis, blood pressure changes, nausea, vomiting, reactions to medication both toxic or allergic, and postpartum back pain.  Patient expressed understanding and wished to proceed.  All questions were answered.  Sterile technique used throughout procedure.  CSF was clear.  No parasthesia or other complications.  Please see nursing notes for vital signs.

## 2012-03-20 LAB — CBC
HCT: 29.1 % — ABNORMAL LOW (ref 36.0–46.0)
MCHC: 33.3 g/dL (ref 30.0–36.0)
RDW: 13.1 % (ref 11.5–15.5)

## 2012-03-20 NOTE — Anesthesia Postprocedure Evaluation (Signed)
  Anesthesia Post-op Note  Patient: Carly Thomas  Procedure(s) Performed: Procedure(s) (LRB): CESAREAN SECTION (N/A)  Patient Location: PACU and Mother/Baby  Anesthesia Type: Spinal  Level of Consciousness: awake, alert , oriented and patient cooperative  Airway and Oxygen Therapy: Patient Spontanous Breathing  Post-op Pain: none  Post-op Assessment: Post-op Vital signs reviewed  Post-op Vital Signs: Reviewed and stable  Complications: No apparent anesthesia complications

## 2012-03-20 NOTE — Progress Notes (Signed)
POD#1 Pt without c/o. Lochia-light. Adequate pain control. VSSAF IMP/ stable Plan/ routine care.

## 2012-03-21 MED ORDER — OXYCODONE-ACETAMINOPHEN 5-325 MG PO TABS: 1.0000 | ORAL_TABLET | ORAL | Status: AC | PRN

## 2012-03-21 NOTE — Discharge Summary (Signed)
Carly Thomas, Carly Thomas               ACCOUNT NO.:  [id number]  MEDICAL RECORD NO.:  [id number]  LOCATION:  9130                          FACILITY:  [facility]  PHYSICIAN:  Malva Limes, M.D.    DATE OF BIRTH:  [birth year]  DATE OF ADMISSION:  03/19/2012 DATE OF DISCHARGE:  03/21/2012                              DISCHARGE SUMMARY   PRINCIPAL DISCHARGE DIAGNOSES: 1. Intrauterine pregnancy at term. 2. History of prior cesarean section. 3. The patient desires repeat cesarean section.  PRINCIPAL PROCEDURES:  Repeat cesarean section.  HOSPITAL COURSE:  Carly Thomas is a 31 year old white female, G3, P 1-0-1- 1, at term who presented to [facility] on Mar 19, 2012, for repeat cesarean section.  The patient underwent this procedure on Mar 19, 2012, and procedure was performed by Dr. Waynard Reeds.  A complete description can be found in the dictated operative note.  The patient's postop course was benign.  At the time of discharge, she was ambulating without difficulty, eating a regular diet, and had adequate pain control.  The incision appeared to be healing well.  Her hemoglobin postop was 9.7. The patient was discharged to home with prenatal vitamins, ibuprofen, and Percocet.  She will follow up in the office in 4 weeks.  The patient also had normal bowel function at the time of discharge.          ______________________________ Malva Limes, M.D.     MA/MEDQ  D:  03/21/2012  T:  03/21/2012  Job:  [id number]

## 2012-03-21 NOTE — Clinical Social Work Note (Signed)

## 2012-03-21 NOTE — Progress Notes (Signed)
POD#2 Pt states that she is doing well. Would like early discharge. VSSAF IMP/ doing well Plan/ discharge

## 2012-03-22 ENCOUNTER — Encounter (HOSPITAL_COMMUNITY): Payer: Self-pay | Admitting: Obstetrics and Gynecology

## 2013-08-18 ENCOUNTER — Encounter: Payer: Self-pay | Admitting: Podiatry

## 2013-08-18 DIAGNOSIS — M2040 Other hammer toe(s) (acquired), unspecified foot: Secondary | ICD-10-CM

## 2013-08-18 DIAGNOSIS — M2010 Hallux valgus (acquired), unspecified foot: Secondary | ICD-10-CM

## 2013-08-18 DIAGNOSIS — M21619 Bunion of unspecified foot: Secondary | ICD-10-CM

## 2013-08-25 ENCOUNTER — Ambulatory Visit (INDEPENDENT_AMBULATORY_CARE_PROVIDER_SITE_OTHER): Payer: BC Managed Care – PPO

## 2013-08-25 ENCOUNTER — Encounter: Payer: Self-pay | Admitting: Podiatry

## 2013-08-25 ENCOUNTER — Ambulatory Visit (INDEPENDENT_AMBULATORY_CARE_PROVIDER_SITE_OTHER): Payer: BC Managed Care – PPO | Admitting: Podiatry

## 2013-08-25 VITALS — BP 114/60 | HR 67 | Resp 12

## 2013-08-25 DIAGNOSIS — M2010 Hallux valgus (acquired), unspecified foot: Secondary | ICD-10-CM

## 2013-08-25 DIAGNOSIS — M21611 Bunion of right foot: Secondary | ICD-10-CM

## 2013-08-25 DIAGNOSIS — Z9889 Other specified postprocedural states: Secondary | ICD-10-CM

## 2013-08-25 DIAGNOSIS — M21619 Bunion of unspecified foot: Secondary | ICD-10-CM

## 2013-08-25 MED ORDER — HYDROCODONE-ACETAMINOPHEN 10-325 MG PO TABS: 1.0000 | ORAL_TABLET | Freq: Three times a day (TID) | ORAL | Status: DC | PRN

## 2013-08-25 NOTE — Progress Notes (Signed)
Subjective:     Patient ID: Carly Thomas, female   DOB: Mar 17, 1981, 32 y.o.   MRN: [id number]  HPI patient states I am doing fine have just been a little dizzy the last several days   Review of Systems  All other systems reviewed and are negative.       Objective:   Physical Exam  Constitutional: She appears well-developed and well-nourished.  Cardiovascular: Intact distal pulses.   Musculoskeletal: Normal range of motion.  Neurological: She is alert.   Patient's right foot is healing well one-week post bunionectomy tailor's bunionectomy and hammertoe repair fifth right. The incision sites are healing well and the structural alignment is good with range of motion that is adequate for this. Postop. Negative Homans sign noted    Assessment:     Well-healing surgical site right foot post surgery    Plan:     X-ray reviewed and sterile structures none continued immobilization reappoint 2 weeks for suture removal earlier if any issues should occur

## 2013-09-08 ENCOUNTER — Ambulatory Visit (INDEPENDENT_AMBULATORY_CARE_PROVIDER_SITE_OTHER): Payer: BC Managed Care – PPO

## 2013-09-08 ENCOUNTER — Encounter: Payer: Self-pay | Admitting: Podiatry

## 2013-09-08 ENCOUNTER — Encounter: Payer: BC Managed Care – PPO | Admitting: Podiatry

## 2013-09-08 ENCOUNTER — Ambulatory Visit (INDEPENDENT_AMBULATORY_CARE_PROVIDER_SITE_OTHER): Payer: BC Managed Care – PPO | Admitting: Podiatry

## 2013-09-08 VITALS — BP 113/68 | HR 62 | Resp 16

## 2013-09-08 DIAGNOSIS — M2010 Hallux valgus (acquired), unspecified foot: Secondary | ICD-10-CM

## 2013-09-08 DIAGNOSIS — Z9889 Other specified postprocedural states: Secondary | ICD-10-CM

## 2013-09-08 DIAGNOSIS — S93609A Unspecified sprain of unspecified foot, initial encounter: Secondary | ICD-10-CM

## 2013-09-08 DIAGNOSIS — M2040 Other hammer toe(s) (acquired), unspecified foot: Secondary | ICD-10-CM

## 2013-09-08 NOTE — Progress Notes (Signed)
Subjective:     Patient ID: Carly Thomas, female   DOB: 11-19-1980, 32 y.o.   MRN: [id number]  HPI patient states I'm doing fine with my foot but I did bang it am worried I moved the bone stitches are still intact and I'm able to walk relatively comfortably   Review of Systems     Objective:   Physical Exam    neurovascular status intact and negative Homans sign noted wound edges are healed well with mild increase of edema around the first MPJ secondary to trauma. Stitches intact right fifth Assessment:     Patient doing well and advised on physical therapy wide shoe gear and range of motion exercises    Plan:     X-rays reviewed and compression applied to the patient

## 2013-09-14 ENCOUNTER — Telehealth: Payer: Self-pay | Admitting: *Deleted

## 2013-09-14 DIAGNOSIS — Z9889 Other specified postprocedural states: Secondary | ICD-10-CM

## 2013-09-14 MED ORDER — HYDROCODONE-ACETAMINOPHEN 10-325 MG PO TABS: 1.0000 | ORAL_TABLET | Freq: Three times a day (TID) | ORAL | Status: DC | PRN

## 2013-09-14 NOTE — Telephone Encounter (Signed)
Pt complains of post-op pain, states 3 weeks post-op and really hurts this week.  Dr Charlsie Merles orders refill Vicodin as before.  Vicodin reordered, informed pt that she would need to pick up rx in the [place] office, and encouraged pt to cut back some of her activities.

## 2013-09-19 ENCOUNTER — Telehealth: Payer: Self-pay | Admitting: *Deleted

## 2013-09-19 ENCOUNTER — Ambulatory Visit (INDEPENDENT_AMBULATORY_CARE_PROVIDER_SITE_OTHER): Payer: BC Managed Care – PPO

## 2013-09-19 ENCOUNTER — Ambulatory Visit (INDEPENDENT_AMBULATORY_CARE_PROVIDER_SITE_OTHER): Payer: BC Managed Care – PPO | Admitting: Podiatry

## 2013-09-19 ENCOUNTER — Encounter: Payer: Self-pay | Admitting: Podiatry

## 2013-09-19 VITALS — BP 99/62 | HR 79 | Resp 16

## 2013-09-19 DIAGNOSIS — M2010 Hallux valgus (acquired), unspecified foot: Secondary | ICD-10-CM

## 2013-09-19 DIAGNOSIS — Z9889 Other specified postprocedural states: Secondary | ICD-10-CM

## 2013-09-19 DIAGNOSIS — L259 Unspecified contact dermatitis, unspecified cause: Secondary | ICD-10-CM

## 2013-09-19 MED ORDER — METHYLPREDNISOLONE 4 MG PO KIT: PACK | ORAL | Status: DC

## 2013-09-19 NOTE — Telephone Encounter (Signed)
Pt complains of large red bumps at incision site, is 4 weeks post-op.  Referred to scheduler for an appt as soon as possible.

## 2013-09-20 NOTE — Progress Notes (Signed)
Subjective:     Patient ID: Carly Thomas, female   DOB: 1980-11-24, 32 y.o.   MRN: [id number]  HPI patient presents stating have some swelling of my foot and also a rash on the outside of my right foot over the last few day. Used cortisone cream and antifungal cream on it   Review of Systems     Objective:   Physical Exam  Nursing note and vitals reviewed. Constitutional: She is oriented to person, place, and time.  Musculoskeletal: Normal range of motion.  Neurological: She is oriented to person, place, and time.  Skin: Skin is warm.   patient has some breakout on the lateral side of the right foot proximal to the fifth metatarsal incision site. Also noted to have some excessive swelling around fifth metatarsal head right. Incision sites themselves are healing very well with good alignment and range of motion    Assessment:     Dermatitis right fifth metatarsal which may be reaction to anklet or possible glue    Plan:     Starting patient on Medrol Dosepak with instructions. X-ray the foot precautionary due to swelling today. Continue with open toed shoe and instructed on cortisone cream and if not improved he'll let us know. Reappoint in 2 weeks

## 2013-10-13 ENCOUNTER — Encounter: Payer: BC Managed Care – PPO | Admitting: Podiatry

## 2013-10-13 ENCOUNTER — Ambulatory Visit (INDEPENDENT_AMBULATORY_CARE_PROVIDER_SITE_OTHER): Payer: BC Managed Care – PPO | Admitting: Podiatry

## 2013-10-13 ENCOUNTER — Ambulatory Visit (INDEPENDENT_AMBULATORY_CARE_PROVIDER_SITE_OTHER): Payer: BC Managed Care – PPO

## 2013-10-13 ENCOUNTER — Encounter: Payer: Self-pay | Admitting: Podiatry

## 2013-10-13 VITALS — BP 104/72 | HR 69 | Resp 12 | Ht 64.0 in | Wt 145.0 lb

## 2013-10-13 DIAGNOSIS — L259 Unspecified contact dermatitis, unspecified cause: Secondary | ICD-10-CM

## 2013-10-13 DIAGNOSIS — Z9889 Other specified postprocedural states: Secondary | ICD-10-CM

## 2013-10-13 DIAGNOSIS — M2040 Other hammer toe(s) (acquired), unspecified foot: Secondary | ICD-10-CM

## 2013-10-13 DIAGNOSIS — M2010 Hallux valgus (acquired), unspecified foot: Secondary | ICD-10-CM

## 2013-10-13 NOTE — Progress Notes (Signed)
Subjective:     Patient ID: Carly Thomas, female   DOB: 1981/02/11, 32 y.o.   MRN: [id number]  HPI patient points to right foot and states doing much better but I still getting some swelling if him on it too much and I've noted a little bit of bumps around the first metatarsal right   Review of Systems     Objective:   Physical Exam Neurovascular status intact with incision site healing well on both feet. The right fifth MPJ is mildly swollen but not sore when pressed and there is some bumps around the right first metatarsal and right hallux    Assessment:     Well-healing surgical site with mild dermatitis also present on her left hand    Plan:     Reviewed x-rays of the right foot and discussed increased activity and recommended dermatologist to take look at the skin condition and involving her hand

## 2013-11-21 NOTE — Progress Notes (Signed)
1) Austin bunionectomy right foot 2) Metatarsal osteotomy 5th met right foot 3) Hammer toe 5th toe right foot

## 2013-12-15 ENCOUNTER — Encounter: Payer: BC Managed Care – PPO | Admitting: Podiatry

## 2013-12-15 ENCOUNTER — Ambulatory Visit (INDEPENDENT_AMBULATORY_CARE_PROVIDER_SITE_OTHER): Payer: BC Managed Care – PPO

## 2013-12-15 ENCOUNTER — Ambulatory Visit (INDEPENDENT_AMBULATORY_CARE_PROVIDER_SITE_OTHER): Payer: BC Managed Care – PPO | Admitting: Podiatry

## 2013-12-15 VITALS — BP 117/80 | HR 80 | Resp 12

## 2013-12-15 DIAGNOSIS — Z9889 Other specified postprocedural states: Secondary | ICD-10-CM

## 2013-12-15 DIAGNOSIS — M21619 Bunion of unspecified foot: Secondary | ICD-10-CM

## 2013-12-15 DIAGNOSIS — M2010 Hallux valgus (acquired), unspecified foot: Secondary | ICD-10-CM

## 2013-12-15 NOTE — Progress Notes (Signed)
Subjective:     Patient ID: Carly SeverinAmber J Olejnik, female   DOB: 02/17/1981, 33 y.o.   MRN: [id number]  HPI patient states I'm doing well with mild swelling I have been on my foot for to long time   Review of Systems     Objective:   Physical Exam Neurovascular status intact with well-healed surgical sites first and fifth metatarsal both feet with good range of motion of the first MPJ noted of both feet    Assessment:     Healing well from structural bunion and metatarsal correction of both feet along with fifth toe right foot    Plan:     Reviewed x-rays and discussed condition. Explained bone healing is still occurring but that everything looks good and uneventful healing should occur over the next 3-6 months. Allowed to return to normal activity at this time

## 2014-02-28 ENCOUNTER — Other Ambulatory Visit: Payer: Self-pay | Admitting: Obstetrics and Gynecology

## 2014-02-28 DIAGNOSIS — N6310 Unspecified lump in the right breast, unspecified quadrant: Secondary | ICD-10-CM

## 2014-03-07 ENCOUNTER — Ambulatory Visit
Admission: RE | Admit: 2014-03-07 | Discharge: 2014-03-07 | Disposition: A | Discharge status: Home / Self Care | Payer: BC Managed Care – PPO | Source: Ambulatory Visit | Attending: Obstetrics and Gynecology | Admitting: Obstetrics and Gynecology

## 2014-03-07 ENCOUNTER — Other Ambulatory Visit: Payer: Self-pay | Admitting: Obstetrics and Gynecology

## 2014-03-07 DIAGNOSIS — N6310 Unspecified lump in the right breast, unspecified quadrant: Secondary | ICD-10-CM

## 2014-03-14 ENCOUNTER — Ambulatory Visit
Admission: RE | Admit: 2014-03-14 | Discharge: 2014-03-14 | Disposition: A | Discharge status: Home / Self Care | Payer: BC Managed Care – PPO | Source: Ambulatory Visit | Attending: Obstetrics and Gynecology | Admitting: Obstetrics and Gynecology

## 2014-03-14 DIAGNOSIS — N6310 Unspecified lump in the right breast, unspecified quadrant: Secondary | ICD-10-CM

## 2014-08-07 ENCOUNTER — Other Ambulatory Visit: Payer: Self-pay | Admitting: Obstetrics and Gynecology

## 2014-08-07 DIAGNOSIS — N6310 Unspecified lump in the right breast, unspecified quadrant: Secondary | ICD-10-CM

## 2014-08-14 ENCOUNTER — Ambulatory Visit
Admission: RE | Admit: 2014-08-14 | Discharge: 2014-08-14 | Disposition: A | Discharge status: Home / Self Care | Payer: BC Managed Care – PPO | Source: Ambulatory Visit | Attending: Obstetrics and Gynecology | Admitting: Obstetrics and Gynecology

## 2014-08-14 DIAGNOSIS — N6310 Unspecified lump in the right breast, unspecified quadrant: Secondary | ICD-10-CM

## 2015-03-08 ENCOUNTER — Ambulatory Visit: Payer: Self-pay | Admitting: Family Medicine

## 2015-04-30 ENCOUNTER — Telehealth: Payer: Self-pay | Admitting: *Deleted

## 2015-04-30 NOTE — Telephone Encounter (Signed)
Pt states she is having foot problems.  I asked pt if she would like an appt and she agreed and I transferred her.

## 2015-05-01 ENCOUNTER — Ambulatory Visit (INDEPENDENT_AMBULATORY_CARE_PROVIDER_SITE_OTHER): Payer: 59 | Admitting: Podiatry

## 2015-05-01 ENCOUNTER — Encounter: Payer: Self-pay | Admitting: Podiatry

## 2015-05-01 ENCOUNTER — Ambulatory Visit: Payer: 59

## 2015-05-01 DIAGNOSIS — M8430XA Stress fracture, unspecified site, initial encounter for fracture: Secondary | ICD-10-CM

## 2015-05-01 DIAGNOSIS — M79672 Pain in left foot: Secondary | ICD-10-CM

## 2015-05-01 NOTE — Progress Notes (Signed)
   Subjective:    Patient ID: Carly Thomas, female    DOB: 07-19-1981, 34 y.o.   MRN: [id number]  HPI PT LT TOP OF THE FOOT BEEN HAVING PAIN FOR 1 WEEK. FOOT IS GETTING WORSE WHEN WALKING/SITTING. TRIED NO TREATMENT.  Review of Systems  Skin: Positive for color change.       Objective:   Physical Exam        Assessment & Plan:

## 2015-05-01 NOTE — Progress Notes (Signed)
Subjective:     Patient ID: Carly Thomas, female   DOB: 1981-07-13, 34 y.o.   MRN: [id number]  HPI patient presents stating she's getting a lot of pain on the left foot and it's been swelling especially since her large dog stepped on the area   Review of Systems     Objective:   Physical Exam Neurovascular status intact muscle strength adequate with inflammation of the left dorsal foot around the second metatarsal shaft with pain when palpated and a mild prominence around the first metatarsal shaft where a previous pin had been inserted    Assessment:     Possible stress fracture versus inflammatory tendinitis dorsal left foot along with prominent pin position left first metatarsal    Plan:     H&P and x-ray reviewed with patient and today I advised on physical therapy compression and anti-inflammatory. Patient was dispensed a surgical shoe with instructions on usage and will be seen back in 3 weeks to reevaluate the fracture

## 2015-05-03 ENCOUNTER — Telehealth: Payer: Self-pay | Admitting: *Deleted

## 2015-05-03 DIAGNOSIS — M79672 Pain in left foot: Secondary | ICD-10-CM | POA: Diagnosis not present

## 2015-05-03 DIAGNOSIS — M8430XA Stress fracture, unspecified site, initial encounter for fracture: Secondary | ICD-10-CM | POA: Diagnosis not present

## 2015-05-03 MED ORDER — HYDROCODONE-ACETAMINOPHEN 5-325 MG PO TABS: 1.0000 | ORAL_TABLET | Freq: Four times a day (QID) | ORAL | Status: DC | PRN

## 2015-05-03 NOTE — Telephone Encounter (Signed)
Will need a full walking boot (pick up here) and can have vicodin if she needs it

## 2015-05-03 NOTE — Telephone Encounter (Addendum)
Pt states her foot is getting progressively more painful and she will be out of town and would like to know what to do for the pain.  I gave Dr. Beverlee Nims recommendations to pt and she agreed and will have her husband pick up the air fx walker boot and Vicodin rx in the [place] office.  Pt states she is very familiar with the air fx walker boot.

## 2016-02-29 DIAGNOSIS — K219 Gastro-esophageal reflux disease without esophagitis: Secondary | ICD-10-CM | POA: Diagnosis not present

## 2016-02-29 DIAGNOSIS — F331 Major depressive disorder, recurrent, moderate: Secondary | ICD-10-CM | POA: Diagnosis not present

## 2016-02-29 DIAGNOSIS — F411 Generalized anxiety disorder: Secondary | ICD-10-CM | POA: Diagnosis not present

## 2016-02-29 DIAGNOSIS — Z6826 Body mass index (BMI) 26.0-26.9, adult: Secondary | ICD-10-CM | POA: Diagnosis not present

## 2016-04-11 DIAGNOSIS — K219 Gastro-esophageal reflux disease without esophagitis: Secondary | ICD-10-CM | POA: Diagnosis not present

## 2016-04-11 DIAGNOSIS — F411 Generalized anxiety disorder: Secondary | ICD-10-CM | POA: Diagnosis not present

## 2016-04-11 DIAGNOSIS — F331 Major depressive disorder, recurrent, moderate: Secondary | ICD-10-CM | POA: Diagnosis not present

## 2016-05-02 DIAGNOSIS — K219 Gastro-esophageal reflux disease without esophagitis: Secondary | ICD-10-CM | POA: Diagnosis not present

## 2016-05-02 DIAGNOSIS — F411 Generalized anxiety disorder: Secondary | ICD-10-CM | POA: Diagnosis not present

## 2016-05-02 DIAGNOSIS — F331 Major depressive disorder, recurrent, moderate: Secondary | ICD-10-CM | POA: Diagnosis not present

## 2016-05-21 DIAGNOSIS — F331 Major depressive disorder, recurrent, moderate: Secondary | ICD-10-CM | POA: Diagnosis not present

## 2016-05-21 DIAGNOSIS — F411 Generalized anxiety disorder: Secondary | ICD-10-CM | POA: Diagnosis not present

## 2016-05-22 DIAGNOSIS — R300 Dysuria: Secondary | ICD-10-CM | POA: Diagnosis not present

## 2016-05-22 DIAGNOSIS — Z113 Encounter for screening for infections with a predominantly sexual mode of transmission: Secondary | ICD-10-CM | POA: Diagnosis not present

## 2016-06-13 DIAGNOSIS — R251 Tremor, unspecified: Secondary | ICD-10-CM | POA: Diagnosis not present

## 2016-06-13 DIAGNOSIS — R197 Diarrhea, unspecified: Secondary | ICD-10-CM | POA: Diagnosis not present

## 2016-06-13 DIAGNOSIS — F411 Generalized anxiety disorder: Secondary | ICD-10-CM | POA: Diagnosis not present

## 2016-06-13 DIAGNOSIS — F331 Major depressive disorder, recurrent, moderate: Secondary | ICD-10-CM | POA: Diagnosis not present

## 2016-07-04 DIAGNOSIS — Z23 Encounter for immunization: Secondary | ICD-10-CM | POA: Diagnosis not present

## 2016-07-04 DIAGNOSIS — R1013 Epigastric pain: Secondary | ICD-10-CM | POA: Diagnosis not present

## 2016-07-04 DIAGNOSIS — K219 Gastro-esophageal reflux disease without esophagitis: Secondary | ICD-10-CM | POA: Diagnosis not present

## 2016-07-04 DIAGNOSIS — F411 Generalized anxiety disorder: Secondary | ICD-10-CM | POA: Diagnosis not present

## 2016-07-04 DIAGNOSIS — F331 Major depressive disorder, recurrent, moderate: Secondary | ICD-10-CM | POA: Diagnosis not present

## 2016-08-20 DIAGNOSIS — F411 Generalized anxiety disorder: Secondary | ICD-10-CM | POA: Diagnosis not present

## 2016-08-20 DIAGNOSIS — F410 Panic disorder [episodic paroxysmal anxiety] without agoraphobia: Secondary | ICD-10-CM | POA: Diagnosis not present

## 2016-09-18 DIAGNOSIS — F4312 Post-traumatic stress disorder, chronic: Secondary | ICD-10-CM | POA: Diagnosis not present

## 2016-09-18 DIAGNOSIS — F4322 Adjustment disorder with anxiety: Secondary | ICD-10-CM | POA: Diagnosis not present

## 2016-09-22 DIAGNOSIS — Z0000 Encounter for general adult medical examination without abnormal findings: Secondary | ICD-10-CM | POA: Diagnosis not present

## 2016-09-22 DIAGNOSIS — Z136 Encounter for screening for cardiovascular disorders: Secondary | ICD-10-CM | POA: Diagnosis not present

## 2016-09-25 DIAGNOSIS — Z0000 Encounter for general adult medical examination without abnormal findings: Secondary | ICD-10-CM | POA: Diagnosis not present

## 2016-09-25 DIAGNOSIS — Z23 Encounter for immunization: Secondary | ICD-10-CM | POA: Diagnosis not present

## 2016-09-30 DIAGNOSIS — F4312 Post-traumatic stress disorder, chronic: Secondary | ICD-10-CM | POA: Diagnosis not present

## 2016-09-30 DIAGNOSIS — F4322 Adjustment disorder with anxiety: Secondary | ICD-10-CM | POA: Diagnosis not present

## 2016-10-08 DIAGNOSIS — Z124 Encounter for screening for malignant neoplasm of cervix: Secondary | ICD-10-CM | POA: Diagnosis not present

## 2016-10-08 DIAGNOSIS — Z6828 Body mass index (BMI) 28.0-28.9, adult: Secondary | ICD-10-CM | POA: Diagnosis not present

## 2016-10-08 DIAGNOSIS — Z01419 Encounter for gynecological examination (general) (routine) without abnormal findings: Secondary | ICD-10-CM | POA: Diagnosis not present

## 2017-03-25 ENCOUNTER — Telehealth: Payer: Self-pay | Admitting: *Deleted

## 2017-03-25 NOTE — Telephone Encounter (Signed)
Pt states had sudden on set of pain in the area of the bunionectomy screw site and didn't know if she needed to come in. I told pt she did need and appt, and transferred to schedulers

## 2017-03-26 ENCOUNTER — Ambulatory Visit (INDEPENDENT_AMBULATORY_CARE_PROVIDER_SITE_OTHER): Payer: 59 | Admitting: Podiatry

## 2017-03-26 ENCOUNTER — Ambulatory Visit (INDEPENDENT_AMBULATORY_CARE_PROVIDER_SITE_OTHER): Payer: 59

## 2017-03-26 DIAGNOSIS — M779 Enthesopathy, unspecified: Secondary | ICD-10-CM

## 2017-03-26 DIAGNOSIS — M79672 Pain in left foot: Secondary | ICD-10-CM | POA: Diagnosis not present

## 2017-03-26 DIAGNOSIS — M778 Other enthesopathies, not elsewhere classified: Secondary | ICD-10-CM

## 2017-03-26 DIAGNOSIS — M7752 Other enthesopathy of left foot: Secondary | ICD-10-CM

## 2017-03-26 MED ORDER — DICLOFENAC SODIUM 75 MG PO TBEC: 75.0000 mg | DELAYED_RELEASE_TABLET | Freq: Two times a day (BID) | ORAL | 2 refills | Status: DC

## 2017-03-26 MED ORDER — TRIAMCINOLONE ACETONIDE 10 MG/ML IJ SUSP
10.0000 mg | Freq: Once | INTRAMUSCULAR | Status: AC
  Administered 2017-03-26: 10 mg

## 2017-03-30 NOTE — Progress Notes (Signed)
Subjective:    Patient ID: Carly SeverinAmber J Blankenship, female   DOB: 36 y.o.   MRN: [id number]   HPI patient presents stating that she's had a lot of pain around her big toe joint left and wants to make sure that none of the fixation is moved and it makes it hard for her to walk and she does not remember specific injury    ROS      Objective:  Physical Exam Neurovascular status intact negative Homans sign noted with well healed surgical site left first MPJ with mild restriction of dorsal motion with inflammation and pain around the joint surface    Assessment:   Inflammatory capsulitis of the first MPJ left which is painful when palpated and making walking difficult with slight possibility of irritation of fixation      Plan:    H&P and x-rays reviewed with patient. Today I injected around the first MPJ 3 mg Kenalog 5 mg Xylocaine and instructed on anti-inflammatories supportive shoes and not going barefoot area reappoint as symptoms indicate  X-rays indicate good healing of the osteotomy with joint congruence and no indications of pathology associated with fixation or indication of acute bony process

## 2017-04-16 ENCOUNTER — Ambulatory Visit: Payer: 59 | Admitting: Podiatry

## 2017-06-18 DIAGNOSIS — K219 Gastro-esophageal reflux disease without esophagitis: Secondary | ICD-10-CM | POA: Diagnosis not present

## 2017-06-18 DIAGNOSIS — F411 Generalized anxiety disorder: Secondary | ICD-10-CM | POA: Diagnosis not present

## 2017-06-18 DIAGNOSIS — Z6828 Body mass index (BMI) 28.0-28.9, adult: Secondary | ICD-10-CM | POA: Diagnosis not present

## 2017-06-18 DIAGNOSIS — R1013 Epigastric pain: Secondary | ICD-10-CM | POA: Diagnosis not present

## 2017-07-07 DIAGNOSIS — R945 Abnormal results of liver function studies: Secondary | ICD-10-CM | POA: Diagnosis not present

## 2017-07-09 DIAGNOSIS — F411 Generalized anxiety disorder: Secondary | ICD-10-CM | POA: Diagnosis not present

## 2017-07-09 DIAGNOSIS — K219 Gastro-esophageal reflux disease without esophagitis: Secondary | ICD-10-CM | POA: Diagnosis not present

## 2017-07-09 DIAGNOSIS — R945 Abnormal results of liver function studies: Secondary | ICD-10-CM | POA: Diagnosis not present

## 2017-08-13 DIAGNOSIS — Z30433 Encounter for removal and reinsertion of intrauterine contraceptive device: Secondary | ICD-10-CM | POA: Diagnosis not present

## 2017-08-13 DIAGNOSIS — Z6828 Body mass index (BMI) 28.0-28.9, adult: Secondary | ICD-10-CM | POA: Diagnosis not present

## 2017-08-14 DIAGNOSIS — F410 Panic disorder [episodic paroxysmal anxiety] without agoraphobia: Secondary | ICD-10-CM | POA: Diagnosis not present

## 2017-08-14 DIAGNOSIS — F331 Major depressive disorder, recurrent, moderate: Secondary | ICD-10-CM | POA: Diagnosis not present

## 2017-08-14 DIAGNOSIS — F411 Generalized anxiety disorder: Secondary | ICD-10-CM | POA: Diagnosis not present

## 2017-09-16 DIAGNOSIS — F410 Panic disorder [episodic paroxysmal anxiety] without agoraphobia: Secondary | ICD-10-CM | POA: Diagnosis not present

## 2017-09-16 DIAGNOSIS — F332 Major depressive disorder, recurrent severe without psychotic features: Secondary | ICD-10-CM | POA: Diagnosis not present

## 2017-09-28 DIAGNOSIS — Z114 Encounter for screening for human immunodeficiency virus [HIV]: Secondary | ICD-10-CM | POA: Diagnosis not present

## 2017-09-28 DIAGNOSIS — Z1329 Encounter for screening for other suspected endocrine disorder: Secondary | ICD-10-CM | POA: Diagnosis not present

## 2017-09-28 DIAGNOSIS — Z13220 Encounter for screening for lipoid disorders: Secondary | ICD-10-CM | POA: Diagnosis not present

## 2017-09-28 DIAGNOSIS — Z0000 Encounter for general adult medical examination without abnormal findings: Secondary | ICD-10-CM | POA: Diagnosis not present

## 2017-09-28 DIAGNOSIS — Z23 Encounter for immunization: Secondary | ICD-10-CM | POA: Diagnosis not present

## 2017-10-05 DIAGNOSIS — Z23 Encounter for immunization: Secondary | ICD-10-CM | POA: Diagnosis not present

## 2017-10-05 DIAGNOSIS — Z6826 Body mass index (BMI) 26.0-26.9, adult: Secondary | ICD-10-CM | POA: Diagnosis not present

## 2017-10-05 DIAGNOSIS — R945 Abnormal results of liver function studies: Secondary | ICD-10-CM | POA: Diagnosis not present

## 2017-10-05 DIAGNOSIS — Z0000 Encounter for general adult medical examination without abnormal findings: Secondary | ICD-10-CM | POA: Diagnosis not present

## 2017-10-08 DIAGNOSIS — Z124 Encounter for screening for malignant neoplasm of cervix: Secondary | ICD-10-CM | POA: Diagnosis not present

## 2017-10-08 DIAGNOSIS — Z6827 Body mass index (BMI) 27.0-27.9, adult: Secondary | ICD-10-CM | POA: Diagnosis not present

## 2017-10-08 DIAGNOSIS — Z01419 Encounter for gynecological examination (general) (routine) without abnormal findings: Secondary | ICD-10-CM | POA: Diagnosis not present

## 2017-10-08 DIAGNOSIS — R102 Pelvic and perineal pain: Secondary | ICD-10-CM | POA: Diagnosis not present

## 2017-10-08 DIAGNOSIS — Z30431 Encounter for routine checking of intrauterine contraceptive device: Secondary | ICD-10-CM | POA: Diagnosis not present

## 2017-10-17 DIAGNOSIS — F3181 Bipolar II disorder: Secondary | ICD-10-CM | POA: Diagnosis not present

## 2017-10-26 DIAGNOSIS — R945 Abnormal results of liver function studies: Secondary | ICD-10-CM | POA: Diagnosis not present

## 2017-10-30 DIAGNOSIS — R945 Abnormal results of liver function studies: Secondary | ICD-10-CM | POA: Diagnosis not present

## 2017-10-30 DIAGNOSIS — K219 Gastro-esophageal reflux disease without esophagitis: Secondary | ICD-10-CM | POA: Diagnosis not present

## 2017-10-30 DIAGNOSIS — Z6828 Body mass index (BMI) 28.0-28.9, adult: Secondary | ICD-10-CM | POA: Diagnosis not present

## 2017-10-30 DIAGNOSIS — Z7289 Other problems related to lifestyle: Secondary | ICD-10-CM | POA: Diagnosis not present

## 2017-11-05 DIAGNOSIS — Z6828 Body mass index (BMI) 28.0-28.9, adult: Secondary | ICD-10-CM | POA: Diagnosis not present

## 2017-11-05 DIAGNOSIS — J069 Acute upper respiratory infection, unspecified: Secondary | ICD-10-CM | POA: Diagnosis not present

## 2017-11-20 DIAGNOSIS — F1020 Alcohol dependence, uncomplicated: Secondary | ICD-10-CM | POA: Diagnosis not present

## 2017-11-20 DIAGNOSIS — J069 Acute upper respiratory infection, unspecified: Secondary | ICD-10-CM | POA: Diagnosis not present

## 2017-11-20 DIAGNOSIS — Z6829 Body mass index (BMI) 29.0-29.9, adult: Secondary | ICD-10-CM | POA: Diagnosis not present

## 2017-11-20 DIAGNOSIS — F319 Bipolar disorder, unspecified: Secondary | ICD-10-CM | POA: Diagnosis not present

## 2018-02-08 DIAGNOSIS — F331 Major depressive disorder, recurrent, moderate: Secondary | ICD-10-CM | POA: Diagnosis not present

## 2018-02-08 DIAGNOSIS — F900 Attention-deficit hyperactivity disorder, predominantly inattentive type: Secondary | ICD-10-CM | POA: Diagnosis not present

## 2018-02-08 DIAGNOSIS — F1010 Alcohol abuse, uncomplicated: Secondary | ICD-10-CM | POA: Diagnosis not present

## 2018-03-26 DIAGNOSIS — F3181 Bipolar II disorder: Secondary | ICD-10-CM | POA: Diagnosis not present

## 2018-03-26 DIAGNOSIS — F900 Attention-deficit hyperactivity disorder, predominantly inattentive type: Secondary | ICD-10-CM | POA: Diagnosis not present

## 2018-06-04 DIAGNOSIS — F900 Attention-deficit hyperactivity disorder, predominantly inattentive type: Secondary | ICD-10-CM | POA: Diagnosis not present

## 2018-06-04 DIAGNOSIS — F3174 Bipolar disorder, in full remission, most recent episode manic: Secondary | ICD-10-CM | POA: Diagnosis not present

## 2018-06-04 DIAGNOSIS — F3131 Bipolar disorder, current episode depressed, mild: Secondary | ICD-10-CM | POA: Diagnosis not present

## 2018-06-04 DIAGNOSIS — F411 Generalized anxiety disorder: Secondary | ICD-10-CM | POA: Diagnosis not present

## 2018-07-09 DIAGNOSIS — Z23 Encounter for immunization: Secondary | ICD-10-CM | POA: Diagnosis not present

## 2018-07-09 DIAGNOSIS — K219 Gastro-esophageal reflux disease without esophagitis: Secondary | ICD-10-CM | POA: Diagnosis not present

## 2018-07-09 DIAGNOSIS — J312 Chronic pharyngitis: Secondary | ICD-10-CM | POA: Diagnosis not present

## 2018-07-09 DIAGNOSIS — H9201 Otalgia, right ear: Secondary | ICD-10-CM | POA: Diagnosis not present

## 2018-07-09 DIAGNOSIS — Z6829 Body mass index (BMI) 29.0-29.9, adult: Secondary | ICD-10-CM | POA: Diagnosis not present

## 2018-07-30 DIAGNOSIS — F3111 Bipolar disorder, current episode manic without psychotic features, mild: Secondary | ICD-10-CM | POA: Diagnosis not present

## 2018-07-30 DIAGNOSIS — F3131 Bipolar disorder, current episode depressed, mild: Secondary | ICD-10-CM | POA: Diagnosis not present

## 2018-09-09 DIAGNOSIS — F3131 Bipolar disorder, current episode depressed, mild: Secondary | ICD-10-CM | POA: Diagnosis not present

## 2018-09-09 DIAGNOSIS — F3174 Bipolar disorder, in full remission, most recent episode manic: Secondary | ICD-10-CM | POA: Diagnosis not present

## 2018-10-14 DIAGNOSIS — Z13220 Encounter for screening for lipoid disorders: Secondary | ICD-10-CM | POA: Diagnosis not present

## 2018-10-14 DIAGNOSIS — Z0000 Encounter for general adult medical examination without abnormal findings: Secondary | ICD-10-CM | POA: Diagnosis not present

## 2018-10-15 DIAGNOSIS — R1319 Other dysphagia: Secondary | ICD-10-CM | POA: Diagnosis not present

## 2018-10-15 DIAGNOSIS — J029 Acute pharyngitis, unspecified: Secondary | ICD-10-CM | POA: Diagnosis not present

## 2018-10-15 DIAGNOSIS — Z6829 Body mass index (BMI) 29.0-29.9, adult: Secondary | ICD-10-CM | POA: Diagnosis not present

## 2018-10-21 ENCOUNTER — Other Ambulatory Visit: Payer: Self-pay | Admitting: Family Medicine

## 2018-10-21 DIAGNOSIS — J309 Allergic rhinitis, unspecified: Secondary | ICD-10-CM | POA: Diagnosis not present

## 2018-10-21 DIAGNOSIS — Z23 Encounter for immunization: Secondary | ICD-10-CM | POA: Diagnosis not present

## 2018-10-21 DIAGNOSIS — R0989 Other specified symptoms and signs involving the circulatory and respiratory systems: Secondary | ICD-10-CM

## 2018-10-21 DIAGNOSIS — F411 Generalized anxiety disorder: Secondary | ICD-10-CM | POA: Diagnosis not present

## 2018-10-21 DIAGNOSIS — F1020 Alcohol dependence, uncomplicated: Secondary | ICD-10-CM | POA: Diagnosis not present

## 2018-10-21 DIAGNOSIS — Z0000 Encounter for general adult medical examination without abnormal findings: Secondary | ICD-10-CM | POA: Diagnosis not present

## 2018-10-21 DIAGNOSIS — Z6829 Body mass index (BMI) 29.0-29.9, adult: Secondary | ICD-10-CM | POA: Diagnosis not present

## 2018-10-21 DIAGNOSIS — F319 Bipolar disorder, unspecified: Secondary | ICD-10-CM | POA: Diagnosis not present

## 2018-10-28 DIAGNOSIS — F411 Generalized anxiety disorder: Secondary | ICD-10-CM | POA: Diagnosis not present

## 2018-10-28 DIAGNOSIS — J312 Chronic pharyngitis: Secondary | ICD-10-CM | POA: Diagnosis not present

## 2018-10-28 DIAGNOSIS — J309 Allergic rhinitis, unspecified: Secondary | ICD-10-CM | POA: Diagnosis not present

## 2018-11-11 DIAGNOSIS — F3111 Bipolar disorder, current episode manic without psychotic features, mild: Secondary | ICD-10-CM | POA: Diagnosis not present

## 2018-11-11 DIAGNOSIS — F3131 Bipolar disorder, current episode depressed, mild: Secondary | ICD-10-CM | POA: Diagnosis not present

## 2018-12-08 DIAGNOSIS — R1312 Dysphagia, oropharyngeal phase: Secondary | ICD-10-CM | POA: Diagnosis not present

## 2018-12-08 DIAGNOSIS — F17210 Nicotine dependence, cigarettes, uncomplicated: Secondary | ICD-10-CM | POA: Diagnosis not present

## 2018-12-08 DIAGNOSIS — R070 Pain in throat: Secondary | ICD-10-CM | POA: Diagnosis not present

## 2019-01-05 DIAGNOSIS — J208 Acute bronchitis due to other specified organisms: Secondary | ICD-10-CM | POA: Diagnosis not present

## 2019-01-05 DIAGNOSIS — Z6828 Body mass index (BMI) 28.0-28.9, adult: Secondary | ICD-10-CM | POA: Diagnosis not present

## 2019-01-05 DIAGNOSIS — J069 Acute upper respiratory infection, unspecified: Secondary | ICD-10-CM | POA: Diagnosis not present

## 2019-01-05 DIAGNOSIS — J101 Influenza due to other identified influenza virus with other respiratory manifestations: Secondary | ICD-10-CM | POA: Diagnosis not present

## 2019-01-20 DIAGNOSIS — F3176 Bipolar disorder, in full remission, most recent episode depressed: Secondary | ICD-10-CM | POA: Diagnosis not present

## 2019-01-20 DIAGNOSIS — F3174 Bipolar disorder, in full remission, most recent episode manic: Secondary | ICD-10-CM | POA: Diagnosis not present

## 2019-07-22 DIAGNOSIS — F3176 Bipolar disorder, in full remission, most recent episode depressed: Secondary | ICD-10-CM | POA: Diagnosis not present

## 2019-10-03 DIAGNOSIS — Z20828 Contact with and (suspected) exposure to other viral communicable diseases: Secondary | ICD-10-CM | POA: Diagnosis not present

## 2020-01-27 ENCOUNTER — Ambulatory Visit: Payer: Self-pay | Attending: Internal Medicine

## 2020-01-27 DIAGNOSIS — Z23 Encounter for immunization: Secondary | ICD-10-CM

## 2020-01-27 NOTE — Progress Notes (Signed)
   Covid-19 Vaccination Clinic  Name:  Carly Thomas    MRN: [id number] DOB: [birth year]  01/27/2020  Carly Thomas was observed post Covid-19 immunization for 15 minutes without incident. She was provided with Vaccine Information Sheet and instruction to access the V-Safe system.   Carly Thomas was instructed to call 911 with any severe reactions post vaccine: Marland Kitchen Difficulty breathing  . Swelling of face and throat  . A fast heartbeat  . A bad rash all over body  . Dizziness and weakness   Immunizations Administered    Name Date Dose VIS Date Route   Moderna COVID-19 Vaccine 01/27/2020 10:08 AM 0.5 mL 10/11/2019 Intramuscular   Manufacturer: Moderna   Lot: [id number]   NDC: 16606-301-60

## 2020-02-28 ENCOUNTER — Ambulatory Visit: Payer: Self-pay | Attending: Internal Medicine

## 2020-02-28 DIAGNOSIS — Z23 Encounter for immunization: Secondary | ICD-10-CM

## 2020-02-28 NOTE — Progress Notes (Signed)
   Covid-19 Vaccination Clinic  Name:  Carly Thomas    MRN: [id number] DOB: [birth year]  02/28/2020  Ms. Passarelli was observed post Covid-19 immunization for 15 minutes without incident. She was provided with Vaccine Information Sheet and instruction to access the V-Safe system.   Ms. Speich was instructed to call 911 with any severe reactions post vaccine: Marland Kitchen Difficulty breathing  . Swelling of face and throat  . A fast heartbeat  . A bad rash all over body  . Dizziness and weakness   Immunizations Administered    Name Date Dose VIS Date Route   Moderna COVID-19 Vaccine 02/28/2020  9:59 AM 0.5 mL 10/2019 Intramuscular   Manufacturer: Moderna   Lot: [id number]   NDC: 31497-026-37

## 2020-03-17 DIAGNOSIS — F3131 Bipolar disorder, current episode depressed, mild: Secondary | ICD-10-CM | POA: Diagnosis not present

## 2020-03-17 DIAGNOSIS — F3174 Bipolar disorder, in full remission, most recent episode manic: Secondary | ICD-10-CM | POA: Diagnosis not present

## 2020-10-01 ENCOUNTER — Ambulatory Visit: Payer: Self-pay

## 2020-10-13 ENCOUNTER — Ambulatory Visit: Payer: BC Managed Care – PPO | Attending: Internal Medicine

## 2020-10-13 DIAGNOSIS — Z23 Encounter for immunization: Secondary | ICD-10-CM

## 2020-10-13 NOTE — Progress Notes (Signed)
   Covid-19 Vaccination Clinic  Name:  [name]    MRN: [id number] DOB: Apr 08, 1981  10/13/2020  Carly Thomas was observed post Covid-19 immunization for 15 minutes without incident. She was provided with Vaccine Information Sheet and instruction to access the V-Safe system.   [name] was instructed to call 911 with any severe reactions post vaccine: Marland Kitchen Difficulty breathing  . Swelling of face and throat  . A fast heartbeat  . A bad rash all over body  . Dizziness and weakness   Immunizations Administered    No immunizations on file.

## 2020-11-29 DIAGNOSIS — Z20822 Contact with and (suspected) exposure to covid-19: Secondary | ICD-10-CM | POA: Diagnosis not present

## 2020-12-12 DIAGNOSIS — F3131 Bipolar disorder, current episode depressed, mild: Secondary | ICD-10-CM | POA: Diagnosis not present

## 2021-09-21 DIAGNOSIS — F3176 Bipolar disorder, in full remission, most recent episode depressed: Secondary | ICD-10-CM | POA: Diagnosis not present

## 2022-01-01 DIAGNOSIS — F3176 Bipolar disorder, in full remission, most recent episode depressed: Secondary | ICD-10-CM | POA: Diagnosis not present

## 2022-01-01 DIAGNOSIS — F3174 Bipolar disorder, in full remission, most recent episode manic: Secondary | ICD-10-CM | POA: Diagnosis not present

## 2022-06-11 DIAGNOSIS — Z114 Encounter for screening for human immunodeficiency virus [HIV]: Secondary | ICD-10-CM | POA: Diagnosis not present

## 2022-06-11 DIAGNOSIS — Z0000 Encounter for general adult medical examination without abnormal findings: Secondary | ICD-10-CM | POA: Diagnosis not present

## 2022-06-13 ENCOUNTER — Other Ambulatory Visit: Payer: Self-pay | Admitting: Emergency Medicine

## 2022-06-13 ENCOUNTER — Other Ambulatory Visit: Payer: Self-pay | Admitting: Family Medicine

## 2022-06-13 DIAGNOSIS — N6310 Unspecified lump in the right breast, unspecified quadrant: Secondary | ICD-10-CM

## 2022-06-13 DIAGNOSIS — Z0000 Encounter for general adult medical examination without abnormal findings: Secondary | ICD-10-CM | POA: Diagnosis not present

## 2022-06-13 DIAGNOSIS — E782 Mixed hyperlipidemia: Secondary | ICD-10-CM | POA: Diagnosis not present

## 2022-06-13 DIAGNOSIS — N6459 Other signs and symptoms in breast: Secondary | ICD-10-CM | POA: Diagnosis not present

## 2022-06-13 DIAGNOSIS — R7401 Elevation of levels of liver transaminase levels: Secondary | ICD-10-CM | POA: Diagnosis not present

## 2022-06-18 DIAGNOSIS — N6310 Unspecified lump in the right breast, unspecified quadrant: Secondary | ICD-10-CM | POA: Diagnosis not present

## 2022-06-18 DIAGNOSIS — R922 Inconclusive mammogram: Secondary | ICD-10-CM | POA: Diagnosis not present

## 2022-07-02 DIAGNOSIS — F3176 Bipolar disorder, in full remission, most recent episode depressed: Secondary | ICD-10-CM | POA: Diagnosis not present

## 2022-07-02 DIAGNOSIS — F3174 Bipolar disorder, in full remission, most recent episode manic: Secondary | ICD-10-CM | POA: Diagnosis not present

## 2023-01-01 DIAGNOSIS — E782 Mixed hyperlipidemia: Secondary | ICD-10-CM | POA: Diagnosis not present

## 2023-01-01 DIAGNOSIS — F1020 Alcohol dependence, uncomplicated: Secondary | ICD-10-CM | POA: Diagnosis not present

## 2023-01-06 DIAGNOSIS — F1020 Alcohol dependence, uncomplicated: Secondary | ICD-10-CM | POA: Diagnosis not present

## 2023-01-06 DIAGNOSIS — H6123 Impacted cerumen, bilateral: Secondary | ICD-10-CM | POA: Diagnosis not present

## 2023-01-06 DIAGNOSIS — K219 Gastro-esophageal reflux disease without esophagitis: Secondary | ICD-10-CM | POA: Diagnosis not present

## 2023-01-06 DIAGNOSIS — E782 Mixed hyperlipidemia: Secondary | ICD-10-CM | POA: Diagnosis not present

## 2023-01-06 DIAGNOSIS — F319 Bipolar disorder, unspecified: Secondary | ICD-10-CM | POA: Diagnosis not present

## 2023-01-17 DIAGNOSIS — F4321 Adjustment disorder with depressed mood: Secondary | ICD-10-CM | POA: Diagnosis not present

## 2023-01-17 DIAGNOSIS — F3174 Bipolar disorder, in full remission, most recent episode manic: Secondary | ICD-10-CM | POA: Diagnosis not present

## 2023-01-17 DIAGNOSIS — F3131 Bipolar disorder, current episode depressed, mild: Secondary | ICD-10-CM | POA: Diagnosis not present

## 2023-03-12 DIAGNOSIS — F3176 Bipolar disorder, in full remission, most recent episode depressed: Secondary | ICD-10-CM | POA: Diagnosis not present

## 2023-03-12 DIAGNOSIS — F3174 Bipolar disorder, in full remission, most recent episode manic: Secondary | ICD-10-CM | POA: Diagnosis not present

## 2023-04-20 DIAGNOSIS — N760 Acute vaginitis: Secondary | ICD-10-CM | POA: Diagnosis not present

## 2023-04-20 DIAGNOSIS — Z6825 Body mass index (BMI) 25.0-25.9, adult: Secondary | ICD-10-CM | POA: Diagnosis not present

## 2023-04-20 DIAGNOSIS — N762 Acute vulvitis: Secondary | ICD-10-CM | POA: Diagnosis not present

## 2023-04-20 DIAGNOSIS — R300 Dysuria: Secondary | ICD-10-CM | POA: Diagnosis not present

## 2023-04-23 DIAGNOSIS — Z6825 Body mass index (BMI) 25.0-25.9, adult: Secondary | ICD-10-CM | POA: Diagnosis not present

## 2023-04-23 DIAGNOSIS — N760 Acute vaginitis: Secondary | ICD-10-CM | POA: Diagnosis not present

## 2023-05-07 DIAGNOSIS — N760 Acute vaginitis: Secondary | ICD-10-CM | POA: Diagnosis not present

## 2023-05-07 DIAGNOSIS — Z113 Encounter for screening for infections with a predominantly sexual mode of transmission: Secondary | ICD-10-CM | POA: Diagnosis not present

## 2023-05-07 DIAGNOSIS — N762 Acute vulvitis: Secondary | ICD-10-CM | POA: Diagnosis not present

## 2023-05-21 DIAGNOSIS — Z124 Encounter for screening for malignant neoplasm of cervix: Secondary | ICD-10-CM | POA: Diagnosis not present

## 2023-05-21 DIAGNOSIS — Z01419 Encounter for gynecological examination (general) (routine) without abnormal findings: Secondary | ICD-10-CM | POA: Diagnosis not present

## 2023-05-21 DIAGNOSIS — Z6824 Body mass index (BMI) 24.0-24.9, adult: Secondary | ICD-10-CM | POA: Diagnosis not present

## 2023-06-30 ENCOUNTER — Inpatient Hospital Stay (HOSPITAL_BASED_OUTPATIENT_CLINIC_OR_DEPARTMENT_OTHER)
Admission: EM | Admit: 2023-06-30 | Discharge: 2023-07-03 | DRG: 439 | Disposition: A | Discharge status: Home / Self Care | Payer: 59 | Attending: Internal Medicine | Admitting: Internal Medicine

## 2023-06-30 ENCOUNTER — Encounter (HOSPITAL_BASED_OUTPATIENT_CLINIC_OR_DEPARTMENT_OTHER): Payer: Self-pay

## 2023-06-30 ENCOUNTER — Other Ambulatory Visit: Payer: Self-pay

## 2023-06-30 DIAGNOSIS — E781 Pure hyperglyceridemia: Secondary | ICD-10-CM | POA: Diagnosis not present

## 2023-06-30 DIAGNOSIS — K858 Other acute pancreatitis without necrosis or infection: Principal | ICD-10-CM | POA: Diagnosis present

## 2023-06-30 DIAGNOSIS — K8591 Acute pancreatitis with uninfected necrosis, unspecified: Secondary | ICD-10-CM | POA: Diagnosis present

## 2023-06-30 DIAGNOSIS — E162 Hypoglycemia, unspecified: Secondary | ICD-10-CM | POA: Diagnosis not present

## 2023-06-30 DIAGNOSIS — N179 Acute kidney failure, unspecified: Secondary | ICD-10-CM | POA: Diagnosis present

## 2023-06-30 DIAGNOSIS — Z79899 Other long term (current) drug therapy: Secondary | ICD-10-CM

## 2023-06-30 DIAGNOSIS — D539 Nutritional anemia, unspecified: Secondary | ICD-10-CM | POA: Diagnosis present

## 2023-06-30 DIAGNOSIS — F319 Bipolar disorder, unspecified: Secondary | ICD-10-CM | POA: Diagnosis present

## 2023-06-30 DIAGNOSIS — E860 Dehydration: Secondary | ICD-10-CM | POA: Diagnosis present

## 2023-06-30 DIAGNOSIS — K2980 Duodenitis without bleeding: Secondary | ICD-10-CM | POA: Diagnosis present

## 2023-06-30 DIAGNOSIS — Z8489 Family history of other specified conditions: Secondary | ICD-10-CM

## 2023-06-30 DIAGNOSIS — K529 Noninfective gastroenteritis and colitis, unspecified: Secondary | ICD-10-CM | POA: Diagnosis present

## 2023-06-30 DIAGNOSIS — K859 Acute pancreatitis without necrosis or infection, unspecified: Principal | ICD-10-CM

## 2023-06-30 DIAGNOSIS — Z810 Family history of intellectual disabilities: Secondary | ICD-10-CM

## 2023-06-30 DIAGNOSIS — K760 Fatty (change of) liver, not elsewhere classified: Secondary | ICD-10-CM | POA: Diagnosis present

## 2023-06-30 DIAGNOSIS — E871 Hypo-osmolality and hyponatremia: Secondary | ICD-10-CM | POA: Diagnosis present

## 2023-06-30 LAB — CBC
Hemoglobin: 12.7 g/dL (ref 12.0–15.0)
Platelets: 212 10*3/uL (ref 150–400)
WBC: 15.2 10*3/uL — ABNORMAL HIGH (ref 4.0–10.5)

## 2023-06-30 MED ORDER — LACTATED RINGERS IV BOLUS
1000.0000 mL | Freq: Once | INTRAVENOUS | Status: AC
  Administered 2023-07-01: 1000 mL via INTRAVENOUS

## 2023-06-30 MED ORDER — FENTANYL CITRATE PF 50 MCG/ML IJ SOSY
50.0000 ug | PREFILLED_SYRINGE | Freq: Once | INTRAMUSCULAR | Status: AC
  Administered 2023-07-01: 50 ug via INTRAVENOUS
  Filled 2023-06-30: qty 1

## 2023-06-30 MED ORDER — ONDANSETRON HCL 4 MG/2ML IJ SOLN
4.0000 mg | Freq: Once | INTRAMUSCULAR | Status: AC
  Administered 2023-07-01: 4 mg via INTRAVENOUS
  Filled 2023-06-30: qty 2

## 2023-06-30 NOTE — ED Triage Notes (Signed)
Pt c/o extreme abdominal pain for 3 weeks and it was worse yesterday and today and vomited once today.

## 2023-06-30 NOTE — ED Provider Notes (Signed)
[place] EMERGENCY DEPARTMENT AT [facility]  Provider Note  CSN: [id number] Arrival date & time: 06/30/23 2048  History Chief Complaint  Patient presents with   Abdominal Pain    Carly Thomas is a 42 y.o. female with history of bipolar but no other significant medical problems reports she has had pelvic floor spasms since an episode of BV in May this year. She reports in the last 2-3 weeks she has had more generalized abdominal pain, not well controlled with gabapentin prescribed for her pelvic floor pain, associated with nausea, anorexia, and occasional vomiting. She denies any prior abdominal surgeries.    Home Medications Prior to Admission medications   Medication Sig Start Date End Date Taking? Authorizing Provider  acetaminophen (TYLENOL) 500 MG tablet Take 1,000 mg by mouth daily as needed. Headache, backache    [provider]  diclofenac (VOLTAREN) 75 MG EC tablet Take 1 tablet (75 mg total) by mouth 2 (two) times daily. 03/26/17   Lenn Sink, DPM  HYDROcodone-acetaminophen (NORCO/VICODIN) 5-325 MG per tablet Take 1 tablet by mouth every 6 (six) hours as needed for moderate pain. 05/03/15   Lenn Sink, DPM  omeprazole (PRILOSEC) 40 MG capsule Take 40 mg by mouth daily.    [provider]  ranitidine (ZANTAC) 300 MG tablet Take 300 mg by mouth at bedtime.    [provider]     Allergies    Patient has no known allergies.   Review of Systems   Review of Systems Please see HPI for pertinent positives and negatives  Physical Exam BP 113/79   Pulse (!) 112   Temp 98.3 F (36.8 C) (Oral)   Resp 13   Ht 5\' 4"  (1.626 m)   Wt 65.3 kg   SpO2 93%   BMI 24.72 kg/m   Physical Exam Vitals and nursing note reviewed.  Constitutional:      Appearance: Normal appearance.  HENT:     Head: Normocephalic and atraumatic.     Nose: Nose normal.     Mouth/Throat:     Mouth: Mucous membranes are moist.  Eyes:     Extraocular  Movements: Extraocular movements intact.     Conjunctiva/sclera: Conjunctivae normal.  Cardiovascular:     Rate and Rhythm: Normal rate.  Pulmonary:     Effort: Pulmonary effort is normal.     Breath sounds: Normal breath sounds.  Abdominal:     General: Abdomen is flat.     Palpations: Abdomen is soft.     Tenderness: There is generalized abdominal tenderness. There is no guarding. Negative signs include Murphy's sign and McBurney's sign.  Musculoskeletal:        General: No swelling. Normal range of motion.     Cervical back: Neck supple.  Skin:    General: Skin is warm and dry.  Neurological:     General: No focal deficit present.     Mental Status: She is alert.  Psychiatric:        Mood and Affect: Mood normal.     ED Results / Procedures / Treatments   EKG None  Procedures .Critical Care  Performed by: Pollyann Savoy, MD Authorized by: Pollyann Savoy, MD   Critical care provider statement:    Critical care time (minutes):  40   Critical care time was exclusive of:  Separately billable procedures and treating other patients   Critical care was necessary to treat or prevent imminent or life-threatening deterioration of  the following conditions:  Renal failure and metabolic crisis   Critical care was time spent personally by me on the following activities:  Development of treatment plan with patient or surrogate, discussions with consultants, evaluation of patient's response to treatment, examination of patient, ordering and review of laboratory studies, ordering and review of radiographic studies, ordering and performing treatments and interventions, pulse oximetry, re-evaluation of patient's condition and review of old charts   Care discussed with: admitting provider     Medications Ordered in the ED Medications  insulin (MYXREDLIN) 100 units/100 mL infusion for hypertriglyceridemia-induced pancreatitis (has no administration in time range)  dextrose 5 %  solution (has no administration in time range)  fentaNYL (SUBLIMAZE) injection 50 mcg (50 mcg Intravenous Given 07/01/23 0000)  ondansetron (ZOFRAN) injection 4 mg (4 mg Intravenous Given 07/01/23 0000)  lactated ringers bolus 1,000 mL (0 mLs Intravenous Stopped 07/01/23 0139)  HYDROmorphone (DILAUDID) injection 1 mg (1 mg Intravenous Given 07/01/23 0056)    Initial Impression and Plan  Patient here with generalized abdominal pain, different from pelvic floor pain she has been having for several months. CBC shows a leukocytosis but noted lipemic sample and so CMP/Lipase sent to [facility] for analysis. I suspect this pain is going to be pancreatitis, possibly due to hypertriglyceridemia. Will give pain/nausea meds for comfort. Begin IVF, check CBG and add lipid panel. Anticipate CT when labs are resulted.   ED Course   Clinical Course as of 07/01/23 0530  Wed Jul 01, 2023  0031 Lipase is elevated. CMP with signs of AKI, elevated LFTs/Bilirubin, could be obstructive. Will send for CT. Continue IVF hydration and pain control.  [CS]  0145 I personally viewed the images from radiology studies and agree with radiologist interpretation: CT confirms suspected pancreatitis, no signs of pseudocyst or necrosis. Will discuss admission with the hospitalist.  [CS]  0155 Patient's Lipid panel has not yet been couriered to [facility] as it is not typically as STAT lab. I've asked our Lab here to expedite that process as this will make a difference in her treatment plan if Triglycerides are very high.  [CS]  0222 Spoke with Dr. Janalyn Shy, Hospitalist, who will agree to admit, however needs Lipid panel result to ensure she goes to the proper unit if she requires insulin drip.  [CS]  0427 Triglycerides are significantly elevated as suspected. Dr. Janalyn Shy is aware and will start insulin drip.  [CS]    Clinical Course User Index [CS] Pollyann Savoy, MD     MDM Rules/Calculators/A&P Medical Decision Making Problems  Addressed: Acute pancreatitis without infection or necrosis, unspecified pancreatitis type: acute illness or injury that poses a threat to life or bodily functions AKI (acute kidney injury) [facility]): acute illness or injury that poses a threat to life or bodily functions Hypertriglyceridemia: acute illness or injury that poses a threat to life or bodily functions  Amount and/or Complexity of Data Reviewed Labs: ordered. Decision-making details documented in ED Course. Radiology: ordered and independent interpretation performed. Decision-making details documented in ED Course.  Risk Prescription drug management. Parenteral controlled substances. Decision regarding hospitalization.     Final Clinical Impression(s) / ED Diagnoses Final diagnoses:  Acute pancreatitis without infection or necrosis, unspecified pancreatitis type  AKI (acute kidney injury) (HCC)  Hypertriglyceridemia    Rx / DC Orders ED Discharge Orders     None        Pollyann Savoy, MD 07/01/23 0530

## 2023-07-01 ENCOUNTER — Emergency Department (HOSPITAL_BASED_OUTPATIENT_CLINIC_OR_DEPARTMENT_OTHER): Payer: 59

## 2023-07-01 DIAGNOSIS — E781 Pure hyperglyceridemia: Secondary | ICD-10-CM | POA: Diagnosis present

## 2023-07-01 DIAGNOSIS — K2980 Duodenitis without bleeding: Secondary | ICD-10-CM | POA: Diagnosis present

## 2023-07-01 DIAGNOSIS — F319 Bipolar disorder, unspecified: Secondary | ICD-10-CM | POA: Diagnosis present

## 2023-07-01 DIAGNOSIS — Z810 Family history of intellectual disabilities: Secondary | ICD-10-CM | POA: Diagnosis not present

## 2023-07-01 DIAGNOSIS — E162 Hypoglycemia, unspecified: Secondary | ICD-10-CM | POA: Diagnosis not present

## 2023-07-01 DIAGNOSIS — N179 Acute kidney failure, unspecified: Secondary | ICD-10-CM | POA: Diagnosis present

## 2023-07-01 DIAGNOSIS — K8591 Acute pancreatitis with uninfected necrosis, unspecified: Secondary | ICD-10-CM | POA: Diagnosis not present

## 2023-07-01 DIAGNOSIS — Z79899 Other long term (current) drug therapy: Secondary | ICD-10-CM | POA: Diagnosis not present

## 2023-07-01 DIAGNOSIS — K529 Noninfective gastroenteritis and colitis, unspecified: Secondary | ICD-10-CM | POA: Diagnosis present

## 2023-07-01 DIAGNOSIS — E871 Hypo-osmolality and hyponatremia: Secondary | ICD-10-CM | POA: Diagnosis present

## 2023-07-01 DIAGNOSIS — D539 Nutritional anemia, unspecified: Secondary | ICD-10-CM | POA: Diagnosis present

## 2023-07-01 DIAGNOSIS — K760 Fatty (change of) liver, not elsewhere classified: Secondary | ICD-10-CM | POA: Diagnosis present

## 2023-07-01 DIAGNOSIS — E860 Dehydration: Secondary | ICD-10-CM | POA: Diagnosis present

## 2023-07-01 DIAGNOSIS — Z8489 Family history of other specified conditions: Secondary | ICD-10-CM | POA: Diagnosis not present

## 2023-07-01 DIAGNOSIS — K858 Other acute pancreatitis without necrosis or infection: Secondary | ICD-10-CM | POA: Diagnosis present

## 2023-07-01 LAB — URINALYSIS, ROUTINE W REFLEX MICROSCOPIC
Bilirubin Urine: NEGATIVE
Glucose, UA: NEGATIVE mg/dL
Hgb urine dipstick: NEGATIVE
Leukocytes,Ua: NEGATIVE
Nitrite: NEGATIVE
Protein, ur: NEGATIVE mg/dL
Specific Gravity, Urine: 1.014 (ref 1.005–1.030)
pH: 6 (ref 5.0–8.0)

## 2023-07-01 LAB — GLUCOSE, CAPILLARY
Glucose-Capillary: 116 mg/dL — ABNORMAL HIGH (ref 70–99)
Glucose-Capillary: 73 mg/dL (ref 70–99)
Glucose-Capillary: 56 mg/dL — ABNORMAL LOW (ref 70–99)
Glucose-Capillary: 62 mg/dL — ABNORMAL LOW (ref 70–99)
Glucose-Capillary: 94 mg/dL (ref 70–99)
Glucose-Capillary: 69 mg/dL — ABNORMAL LOW (ref 70–99)
Glucose-Capillary: 53 mg/dL — ABNORMAL LOW (ref 70–99)
Glucose-Capillary: 128 mg/dL — ABNORMAL HIGH (ref 70–99)
Glucose-Capillary: 93 mg/dL (ref 70–99)
Glucose-Capillary: 116 mg/dL — ABNORMAL HIGH (ref 70–99)
Glucose-Capillary: 107 mg/dL — ABNORMAL HIGH (ref 70–99)
Glucose-Capillary: 89 mg/dL (ref 70–99)
Glucose-Capillary: 85 mg/dL (ref 70–99)
Glucose-Capillary: 79 mg/dL (ref 70–99)
Glucose-Capillary: 78 mg/dL (ref 70–99)
Glucose-Capillary: 103 mg/dL — ABNORMAL HIGH (ref 70–99)

## 2023-07-01 LAB — CBC WITH DIFFERENTIAL/PLATELET
Abs Immature Granulocytes: 0.05 10*3/uL (ref 0.00–0.07)
Basophils Absolute: 0 10*3/uL (ref 0.0–0.1)
Basophils Relative: 0 %
Eosinophils Absolute: 0.4 10*3/uL (ref 0.0–0.5)
Eosinophils Relative: 4 %
HCT: 27 % — ABNORMAL LOW (ref 36.0–46.0)
Hemoglobin: 9.5 g/dL — ABNORMAL LOW (ref 12.0–15.0)
Immature Granulocytes: 1 %
Lymphocytes Relative: 21 %
Lymphs Abs: 2.1 10*3/uL (ref 0.7–4.0)
MCH: 35.4 pg — ABNORMAL HIGH (ref 26.0–34.0)
MCHC: 35.2 g/dL (ref 30.0–36.0)
MCV: 100.7 fL — ABNORMAL HIGH (ref 80.0–100.0)
Monocytes Absolute: 0.4 10*3/uL (ref 0.1–1.0)
Monocytes Relative: 3 %
Neutro Abs: 7.4 10*3/uL (ref 1.7–7.7)
Neutrophils Relative %: 71 %
Platelets: 156 10*3/uL (ref 150–400)
RBC: 2.68 MIL/uL — ABNORMAL LOW (ref 3.87–5.11)
RDW: 14.1 % (ref 11.5–15.5)
WBC: 10.3 10*3/uL (ref 4.0–10.5)
nRBC: 0 % (ref 0.0–0.2)

## 2023-07-01 LAB — COMPREHENSIVE METABOLIC PANEL
ALT: 56 U/L — ABNORMAL HIGH (ref 0–44)
ALT: 33 U/L (ref 0–44)
AST: 131 U/L — ABNORMAL HIGH (ref 15–41)
AST: 95 U/L — ABNORMAL HIGH (ref 15–41)
Albumin: 2.7 g/dL — ABNORMAL LOW (ref 3.5–5.0)
Albumin: 2.2 g/dL — ABNORMAL LOW (ref 3.5–5.0)
Alkaline Phosphatase: 36 U/L — ABNORMAL LOW (ref 38–126)
Alkaline Phosphatase: 224 U/L — ABNORMAL HIGH (ref 38–126)
Anion gap: 17 — ABNORMAL HIGH (ref 5–15)
Anion gap: 12 (ref 5–15)
BUN: 34 mg/dL — ABNORMAL HIGH (ref 6–20)
BUN: <5 mg/dL — ABNORMAL LOW (ref 6–20)
CO2: 18 mmol/L — ABNORMAL LOW (ref 22–32)
CO2: 20 mmol/L — ABNORMAL LOW (ref 22–32)
Calcium: 8.3 mg/dL — ABNORMAL LOW (ref 8.9–10.3)
Calcium: 7.5 mg/dL — ABNORMAL LOW (ref 8.9–10.3)
Chloride: 95 mmol/L — ABNORMAL LOW (ref 98–111)
Chloride: 97 mmol/L — ABNORMAL LOW (ref 98–111)
Creatinine, Ser: 2.87 mg/dL — ABNORMAL HIGH (ref 0.44–1.00)
Creatinine, Ser: 0.74 mg/dL (ref 0.44–1.00)
GFR, Estimated: 20 mL/min — ABNORMAL LOW (ref >60)
GFR, Estimated: >60 mL/min (ref >60)
Glucose, Bld: 123 mg/dL — ABNORMAL HIGH (ref 70–99)
Glucose, Bld: 183 mg/dL — ABNORMAL HIGH (ref 70–99)
Potassium: 4 mmol/L (ref 3.5–5.1)
Potassium: 3.7 mmol/L (ref 3.5–5.1)
Sodium: 130 mmol/L — ABNORMAL LOW (ref 135–145)
Sodium: 129 mmol/L — ABNORMAL LOW (ref 135–145)
Total Bilirubin: 10.9 mg/dL — ABNORMAL HIGH (ref 0.3–1.2)
Total Bilirubin: 3 mg/dL — ABNORMAL HIGH (ref 0.3–1.2)
Total Protein: 5.2 g/dL — ABNORMAL LOW (ref 6.5–8.1)
Total Protein: 4.6 g/dL — ABNORMAL LOW (ref 6.5–8.1)

## 2023-07-01 LAB — LIPID PANEL
Cholesterol: >1000 mg/dL — ABNORMAL HIGH (ref 0–200)
LDL Cholesterol: UNDETERMINED mg/dL (ref 0–99)
Triglycerides: >5000 mg/dL — ABNORMAL HIGH (ref <150)
VLDL: UNDETERMINED mg/dL (ref 0–40)

## 2023-07-01 LAB — PREGNANCY, URINE: Preg Test, Ur: NEGATIVE

## 2023-07-01 LAB — TRIGLYCERIDES
Triglycerides: 773 mg/dL — ABNORMAL HIGH (ref <150)
Triglycerides: 587 mg/dL — ABNORMAL HIGH (ref <150)

## 2023-07-01 LAB — CBG MONITORING, ED: Glucose-Capillary: 103 mg/dL — ABNORMAL HIGH (ref 70–99)

## 2023-07-01 LAB — LIPASE, BLOOD: Lipase: 1172 U/L — ABNORMAL HIGH (ref 11–51)

## 2023-07-01 LAB — HIV ANTIBODY (ROUTINE TESTING W REFLEX): HIV Screen 4th Generation wRfx: NONREACTIVE

## 2023-07-01 LAB — LDL CHOLESTEROL, DIRECT: Direct LDL: 122 mg/dL — ABNORMAL HIGH (ref 0–99)

## 2023-07-01 MED ORDER — ONDANSETRON HCL 4 MG/2ML IJ SOLN: 4.0000 mg | Freq: Four times a day (QID) | INTRAMUSCULAR | Status: DC | PRN

## 2023-07-01 MED ORDER — DEXTROSE 50 % IV SOLN
INTRAVENOUS | Status: AC
  Administered 2023-07-01: 50 mL
  Filled 2023-07-01: qty 50

## 2023-07-01 MED ORDER — ACETAMINOPHEN 325 MG PO TABS: 650.0000 mg | ORAL_TABLET | Freq: Four times a day (QID) | ORAL | Status: DC | PRN

## 2023-07-01 MED ORDER — DEXTROSE 10 % IV SOLN: INTRAVENOUS | Status: DC

## 2023-07-01 MED ORDER — HEPARIN SODIUM (PORCINE) 5000 UNIT/ML IJ SOLN
5000.0000 [IU] | Freq: Three times a day (TID) | INTRAMUSCULAR | Status: DC
  Administered 2023-07-01 – 2023-07-03 (×6): 5000 [IU] via SUBCUTANEOUS
  Filled 2023-07-01 (×6): qty 1

## 2023-07-01 MED ORDER — PANTOPRAZOLE SODIUM 40 MG IV SOLR
40.0000 mg | INTRAVENOUS | Status: DC
  Administered 2023-07-01 – 2023-07-03 (×3): 40 mg via INTRAVENOUS
  Filled 2023-07-01 (×3): qty 10

## 2023-07-01 MED ORDER — SENNOSIDES-DOCUSATE SODIUM 8.6-50 MG PO TABS: 1.0000 | ORAL_TABLET | Freq: Every evening | ORAL | Status: DC | PRN

## 2023-07-01 MED ORDER — CARIPRAZINE HCL 1.5 MG PO CAPS
3.0000 mg | ORAL_CAPSULE | Freq: Every day | ORAL | Status: DC
  Administered 2023-07-01 – 2023-07-02 (×2): 3 mg via ORAL
  Filled 2023-07-01 (×2): qty 1
  Filled 2023-07-01: qty 2

## 2023-07-01 MED ORDER — ONDANSETRON HCL 4 MG PO TABS: 4.0000 mg | ORAL_TABLET | Freq: Four times a day (QID) | ORAL | Status: DC | PRN

## 2023-07-01 MED ORDER — DEXTROSE 5 % IV SOLN: INTRAVENOUS | Status: DC

## 2023-07-01 MED ORDER — INSULIN (MYXREDLIN) INFUSION FOR HYPERTRIGLYCERIDEMIA
0.0500 [IU]/kg/h | INTRAVENOUS | Status: DC
  Administered 2023-07-01: 0.1 [IU]/kg/h via INTRAVENOUS
  Administered 2023-07-02: 0.05 [IU]/kg/h via INTRAVENOUS
  Filled 2023-07-01 (×2): qty 100

## 2023-07-01 MED ORDER — HYDROMORPHONE HCL 1 MG/ML IJ SOLN
0.5000 mg | INTRAMUSCULAR | Status: DC | PRN
  Administered 2023-07-01 – 2023-07-03 (×3): 1 mg via INTRAVENOUS
  Filled 2023-07-01 (×3): qty 1

## 2023-07-01 MED ORDER — GLUCOSE 40 % PO GEL
ORAL | Status: AC
  Filled 2023-07-01: qty 1.21

## 2023-07-01 MED ORDER — LACTATED RINGERS IV SOLN: INTRAVENOUS | Status: DC

## 2023-07-01 MED ORDER — HYDROCODONE-ACETAMINOPHEN 5-325 MG PO TABS
1.0000 | ORAL_TABLET | ORAL | Status: DC | PRN
  Administered 2023-07-01 – 2023-07-03 (×7): 2 via ORAL
  Administered 2023-07-03: 1 via ORAL
  Filled 2023-07-01: qty 1
  Filled 2023-07-01 (×7): qty 2

## 2023-07-01 MED ORDER — ACETAMINOPHEN 650 MG RE SUPP: 650.0000 mg | Freq: Four times a day (QID) | RECTAL | Status: DC | PRN

## 2023-07-01 MED ORDER — HYDROMORPHONE HCL 1 MG/ML IJ SOLN
1.0000 mg | Freq: Once | INTRAMUSCULAR | Status: AC
  Administered 2023-07-01: 1 mg via INTRAVENOUS
  Filled 2023-07-01: qty 1

## 2023-07-01 MED ORDER — FENOFIBRATE 160 MG PO TABS
160.0000 mg | ORAL_TABLET | Freq: Every day | ORAL | Status: DC
  Administered 2023-07-01 – 2023-07-03 (×3): 160 mg via ORAL
  Filled 2023-07-01 (×3): qty 1

## 2023-07-01 NOTE — ED Notes (Signed)
ED TO INPATIENT HANDOFF REPORT  ED Nurse Name and Phone #:   S Name/Age/Gender Carly Thomas 42 y.o. female Room/Bed: DB005/DB005  Code Status   Code Status: Not on file  Home/SNF/Other Home Patient oriented to: self, place, time, and situation Is this baseline? Yes   Triage Complete: Triage complete  Chief Complaint Acute pancreatitis with uninfected necrosis, unspecified pancreatitis type [K85.91]  Triage Note Pt c/o extreme abdominal pain for 3 weeks and it was worse yesterday and today and vomited once today.    Allergies No Known Allergies  Level of Care/Admitting Diagnosis ED Disposition     ED Disposition  Admit   Condition  --   Comment  Hospital Area: [facility] [[id number]]  Level of Care: Progressive [102]  Admit to Progressive based on following criteria: Other see comments  Comments: Acute pancreatitis.  Pending triglyceride level if it is high need insulin drip that is why requesting for progressive unit  May admit patient to [facility] or [facility] if equivalent level of care is available:: Yes  Interfacility transfer: Yes  Covid Evaluation: Asymptomatic - no recent exposure (last 10 days) testing not required  Diagnosis: Acute pancreatitis with uninfected necrosis, unspecified pancreatitis type [[id number]]  Admitting Physician: Tereasa Coop [[id number]]  Attending Physician: Tereasa Coop [[id number]]  Certification:: I certify this patient will need inpatient services for at least 2 midnights  Expected Medical Readiness: 07/06/2023          B Medical/Surgery History Past Medical History:  Diagnosis Date   Depression    with 1st pregnancy   Headache(784.0)    migraines with 1st pregnancy   Heartburn in pregnancy    Past Surgical History:  Procedure Laterality Date   CESAREAN SECTION     CESAREAN SECTION  03/19/2012   Procedure: CESAREAN SECTION;  Surgeon: Freddrick March. Tenny Craw, MD;  Location: [facility];  Service: Gynecology;   Laterality: N/A;  repeat      A IV Location/Drains/Wounds Patient Lines/Drains/Airways Status     Active Line/Drains/Airways     Name Placement date Placement time Site Days   Peripheral IV 06/30/23 22 G 1" Right Wrist 06/30/23  2355  Wrist  1   Incision 03/19/12 Abdomen Other (Comment) 03/19/12  1243  -- 4121   Incision 03/19/12 Perineum Other (Comment) 03/19/12  1243  -- 4121            Intake/Output Last 24 hours  Intake/Output Summary (Last 24 hours) at 07/01/2023 0326 Last data filed at 07/01/2023 0139 Gross per 24 hour  Intake 1000 ml  Output --  Net 1000 ml    Labs/Imaging Results for orders placed or performed during the hospital encounter of 06/30/23 (from the past 48 hour(s))  Lipase, blood     Status: Abnormal   Collection Time: 06/30/23  8:58 PM  Result Value Ref Range   Lipase 1,172 (H) 11 - 51 U/L    Comment: RESULTS CONFIRMED BY MANUAL DILUTION Performed at [facility], [address] [place], [place] [address]   Comprehensive metabolic panel     Status: Abnormal   Collection Time: 06/30/23  8:58 PM  Result Value Ref Range   Sodium 130 (L) 135 - 145 mmol/L   Potassium 4.0 3.5 - 5.1 mmol/L   Chloride 95 (L) 98 - 111 mmol/L   CO2 18 (L) 22 - 32 mmol/L   Glucose, Bld 123 (H) 70 - 99 mg/dL    Comment: Glucose reference range applies  only to samples taken after fasting for at least 8 hours.   BUN 34 (H) 6 - 20 mg/dL   Creatinine, Ser 1.61 (H) 0.44 - 1.00 mg/dL   Calcium 8.3 (L) 8.9 - 10.3 mg/dL   Total Protein 5.2 (L) 6.5 - 8.1 g/dL   Albumin 2.7 (L) 3.5 - 5.0 g/dL   AST 096 (H) 15 - 41 U/L    Comment: RESULTS CONFIRMED BY MANUAL DILUTION LIPEMIC SPECIMEN, RESULTS MAY BE AFFECTED.    ALT 56 (H) 0 - 44 U/L    Comment: RESULTS CONFIRMED BY MANUAL DILUTION LIPEMIC SPECIMEN, RESULTS MAY BE AFFECTED.    Alkaline Phosphatase 36 (L) 38 - 126 U/L   Total Bilirubin 10.9 (H) 0.3 - 1.2 mg/dL   GFR, Estimated 20 (L) >60 mL/min    Comment:  (NOTE) Calculated using the CKD-EPI Creatinine Equation (2021)    Anion gap 17 (H) 5 - 15    Comment: Performed at [facility], [address], [place], [place] [address]  CBC     Status: Abnormal   Collection Time: 06/30/23  8:58 PM  Result Value Ref Range   WBC 15.2 (H) 4.0 - 10.5 K/uL   RBC RESULTS UNAVAILABLE DUE TO INTERFERING SUBSTANCE 3.87 - 5.11 MIL/uL   Hemoglobin 12.7 12.0 - 15.0 g/dL   HCT RESULTS UNAVAILABLE DUE TO INTERFERING SUBSTANCE 36.0 - 46.0 %   MCV RESULTS UNAVAILABLE DUE TO INTERFERING SUBSTANCE 80.0 - 100.0 fL   MCH RESULTS UNAVAILABLE DUE TO INTERFERING SUBSTANCE 26.0 - 34.0 pg   MCHC RESULTS UNAVAILABLE DUE TO INTERFERING SUBSTANCE 30.0 - 36.0 g/dL   RDW RESULTS UNAVAILABLE DUE TO INTERFERING SUBSTANCE 11.5 - 15.5 %   Platelets 212 150 - 400 K/uL   nRBC RESULTS UNAVAILABLE DUE TO INTERFERING SUBSTANCE 0.0 - 0.2 %    Comment: Performed at [facility], [address] [place], [place] [address]  Urinalysis, Routine w reflex microscopic -Urine, Clean Catch     Status: Abnormal   Collection Time: 07/01/23 12:08 AM  Result Value Ref Range   Color, Urine YELLOW YELLOW   APPearance CLEAR CLEAR   Specific Gravity, Urine 1.014 1.005 - 1.030   pH 6.0 5.0 - 8.0   Glucose, UA NEGATIVE NEGATIVE mg/dL   Hgb urine dipstick NEGATIVE NEGATIVE   Bilirubin Urine NEGATIVE NEGATIVE   Ketones, ur TRACE (A) NEGATIVE mg/dL   Protein, ur NEGATIVE NEGATIVE mg/dL   Nitrite NEGATIVE NEGATIVE   Leukocytes,Ua NEGATIVE NEGATIVE    Comment: Performed at [facility], [address] [place], [place] [address]  Pregnancy, urine     Status: None   Collection Time: 07/01/23 12:08 AM  Result Value Ref Range   Preg Test, Ur NEGATIVE NEGATIVE    Comment:        THE SENSITIVITY OF THIS METHODOLOGY IS >25 mIU/mL. Performed at [facility], [address], [place], [place] [address]   CBG monitoring, ED     Status: Abnormal    Collection Time: 07/01/23  2:40 AM  Result Value Ref Range   Glucose-Capillary 103 (H) 70 - 99 mg/dL    Comment: Glucose reference range applies only to samples taken after fasting for at least 8 hours.   CT ABDOMEN PELVIS WO CONTRAST  Result Date: 07/01/2023 CLINICAL DATA:  Extreme abdominal pain for 3 weeks. EXAM: CT ABDOMEN AND PELVIS WITHOUT CONTRAST TECHNIQUE: Multidetector CT imaging of the abdomen and pelvis was performed following the standard protocol without IV  contrast. RADIATION DOSE REDUCTION: This exam was performed according to the departmental dose-optimization program which includes automated exposure control, adjustment of the mA and/or kV according to patient size and/or use of iterative reconstruction technique. COMPARISON:  None Available. FINDINGS: Lower chest: No acute abnormality. Hepatobiliary: Hepatic steatosis. Sludge within the gallbladder. No biliary dilation. Pancreas: Diffuse pancreatic edema with adjacent peripancreatic fluid. Fluid tracts inferiorly in the right anterior pararenal space into the pelvis. No organized collection. No ductal dilation. Spleen: Unremarkable. Adrenals/Urinary Tract: Normal adrenal glands. No urinary calculi or hydronephrosis. Unremarkable bladder. Stomach/Bowel: Wall thickening and stranding about the duodenum and right colon compatible with reactive duodenitis/colitis. Stomach is within normal limits. Stranding about the appendix is likely reactive. Dense stool in the colon. Vascular/Lymphatic: No significant vascular findings are present. No enlarged abdominal or pelvic lymph nodes. Reproductive: IUD in good position within the uterus. No adnexal mass. Other: No free intraperitoneal air. Musculoskeletal: No acute fracture. IMPRESSION: 1. Acute interstitial pancreatitis with adjacent peripancreatic fluid. No organized collection. 2. Reactive duodenitis/colitis. 3. Hepatic steatosis. Electronically Signed   By: Minerva Fester M.D.   On:  07/01/2023 01:35    Pending Labs Unresulted Labs (From admission, onward)     Start     Ordered   06/30/23 2313  Lipid panel  Once,   URGENT        06/30/23 2312            Vitals/Pain Today's Vitals   07/01/23 0115 07/01/23 0139 07/01/23 0237 07/01/23 0237  BP: 127/82  117/78   Pulse: (!) 115  (!) 129   Resp: 18  16   Temp:    98.1 F (36.7 C)  TempSrc:    Oral  SpO2: 100%  96%   Weight:      Height:      PainSc:  3   2     Isolation Precautions No active isolations  Medications Medications  lactated ringers infusion ( Intravenous New Bag/Given 07/01/23 0237)  fentaNYL (SUBLIMAZE) injection 50 mcg (50 mcg Intravenous Given 07/01/23 0000)  ondansetron (ZOFRAN) injection 4 mg (4 mg Intravenous Given 07/01/23 0000)  lactated ringers bolus 1,000 mL (0 mLs Intravenous Stopped 07/01/23 0139)  HYDROmorphone (DILAUDID) injection 1 mg (1 mg Intravenous Given 07/01/23 0056)    Mobility walks     Focused Assessments    R Recommendations: See Admitting Provider Note  Report given to:   Additional Notes:

## 2023-07-01 NOTE — ED Notes (Signed)
ED Provider at bedside. 

## 2023-07-01 NOTE — Progress Notes (Addendum)
Plan of Care Note for accepted transfer  Patient: Carly Thomas              UEA:[id number]  DOA: 06/30/2023     Facility requesting transfer: [facility] emergency department  Requesting Provider: Susy Frizzle, MD  Reason for transfer: Acute pancreatitis and AKI  Facility course:  42 year old female history of bipolar disorder presented to emergency department complaining of abdominal pain for last 2 weeks.  Patient also has significant pelvic floor spasm and she is taking gabapentin without any much improvement.  She also has associated nausea, anorexia and vomiting.  Denies any fever and chills.  Sensation to ED patient is hemodynamically stable except tachycardic 122 heart rate has been improved 115. Elevated lipase 1172. CMP showed low sodium 130, potassium 4, bicarb 95, low bicarb 18, blood glucose 123, BUN 34, elevated creatinine 2.87, calcium 8.3, elevated AST 131, elevated ALT 56, decreased alkaline phosphatase 36, elevated bilirubin 10.9, GFR 20 and anion gap 11.  CBC shows significant leukocytosis 15.2.  Lipid panel has been sent to check however it is taking time to read by the lab as patient has high lipid contents in her blood.    CT abdomen pelvis showed acute interstitial pancreatitis with adjacent peripancreatic fluid.  No organized collection.  Reactive duodenitis and colitis.  Hepatic steatosis.  In the ED patient was given LR 1 L bolus, hydromorphone 1 mg, fentanyl 1 mg and Zofran 4 mg once.  Hospitalist has been contacted for the admission for the management of acute pancreatitis.  Addendum: Significantly elevated triglyceride above 5000.  Spoke with Dr. Bernette Mayers, planning to start insulin drip with IV dextrose.   Plan of care: Need further evaluation management for acute pancreatitis.  Need to follow-up with lipid panel if patient has significant triglyceridemia possible need for insulin drip.  Please consult GI upon arrival versus in the a.m. to the  hospital.   The patient is accepted for admission to Progressive unit, at [facility].    Check [email or web address] for on-call coverage.  [facility] will assume care on arrival to accepting facility. Until arrival, medical decision making responsibilities remain with the EDP.  However, [facility] available 24/7 for questions and assistance.   Nursing staff please page [facility] Admits and Consults [phone]) as soon as the patient arrives to the hospital.    Author: Tereasa Coop, MD  07/01/2023  [facility]

## 2023-07-01 NOTE — H&P (Signed)
History and Physical    Carly Thomas OZH:[id number] DOB: 02-Aug-1981 DOA: 06/30/2023  PCP: Lewis Moccasin, MD   Patient coming from: Home  I have personally briefly reviewed patient's old medical records in [facility]  Chief Complaint: Abdominal pain  HPI: Carly Thomas is a 42 y.o. female with medical history significant of bipolar disorder, bacterial vaginosis treated with antibiotics few months ago complicated by intermittent pelvic floor spasms presented with worsening abdominal pain for the last 2 to 3 weeks.  Patient has been on gabapentin prescribed for her pelvic floor pain which was being tapered as an outpatient because of associated nausea, anorexia and occasional vomiting.  She was having intermittent abdominal pain which has progressively gotten worse over the last couple of days, come very severe and almost constant in nature along with nausea and vomiting.  No fever, chest pain, shortness of breath, dysuria, hematuria, hematochezia, loss of consciousness or seizures reported.  ED Course: Lipase was 1172, creatinine of 2.87, bicarb of 18 and triglycerides of more than 5000 with cholesterol of more than 1000 and WBC of 15.2.  CT of abdomen showed acute interstitial pancreatitis without necrosis.  She was started on IV fluids and analgesics. Hospitalist service was called to evaluate the patient.  Review of Systems: As per HPI otherwise all other systems were reviewed and are negative.   Past Medical History:  Diagnosis Date   Depression    with 1st pregnancy   Headache(784.0)    migraines with 1st pregnancy   Heartburn in pregnancy     Past Surgical History:  Procedure Laterality Date   CESAREAN SECTION     CESAREAN SECTION  03/19/2012   Procedure: CESAREAN SECTION;  Surgeon: Freddrick March. Tenny Craw, MD;  Location: [facility];  Service: Gynecology;  Laterality: N/A;  repeat     Social history -No history of tobacco abuse.  Drinks alcohol on a regular basis:  Sometimes more as per the patient.  No Known Allergies  Family History  Problem Relation Age of Onset   Spina bifida Maternal Aunt    Mental retardation Maternal Aunt    DiGeorge syndrome Other        Father of Baby's nephew    Prior to Admission medications   Medication Sig Start Date End Date Taking? Authorizing Provider  acetaminophen (TYLENOL) 500 MG tablet Take 1,000 mg by mouth daily as needed. Headache, backache   Yes [provider]  diazepam (VALIUM) 10 MG tablet 10 mg See admin instructions. Insert 1 tablet into the vagina 1 hour before bedtime as needed for pelvic floor spasm.   Yes [provider]  famotidine (PEPCID) 40 MG tablet Take 40 mg by mouth daily as needed for heartburn.   Yes [provider]  folic acid (FOLVITE) 1 MG tablet Take 1 mg by mouth daily. 03/19/23  Yes [provider]  gabapentin (NEURONTIN) 100 MG capsule Take 100 mg by mouth. Take 1 capsule (100mg ) at bedtime for 4 days, then increase to 1 capsule (100mg ) 3 times daily for 1 week. If tolerated increase to 2 capsules (200mg ) 3 times daily for 1 week, then 3 capsules (300mg ) 3 times daily for 1 week. Please contact doctor at this point for further instructions.   Yes [provider]  VRAYLAR 3 MG capsule Take 3 mg by mouth at bedtime.   Yes [provider]  diclofenac (VOLTAREN) 75 MG EC tablet Take 1 tablet (75 mg total) by mouth 2 (two) times  daily. Patient not taking: Reported on 07/01/2023 03/26/17   Lenn Sink, DPM  DULoxetine (CYMBALTA) 20 MG capsule Take 20 mg by mouth daily. Patient not taking: Reported on 07/01/2023 05/12/23   [provider]  HYDROcodone-acetaminophen (NORCO/VICODIN) 5-325 MG per tablet Take 1 tablet by mouth every 6 (six) hours as needed for moderate pain. Patient not taking: Reported on 07/01/2023 05/03/15   Lenn Sink, DPM  lidocaine (XYLOCAINE) 5 % ointment Apply 1 Application topically 4 (four) times daily as  needed for mild pain or moderate pain. Patient not taking: Reported on 07/01/2023    [provider]  omeprazole (PRILOSEC) 40 MG capsule Take 40 mg by mouth daily. Patient not taking: Reported on 07/01/2023    [provider]  ranitidine (ZANTAC) 300 MG tablet Take 300 mg by mouth at bedtime. Patient not taking: Reported on 07/01/2023    [provider]    Physical Exam: Vitals:   07/01/23 0455 07/01/23 0538 07/01/23 0728 07/01/23 1057  BP:  (!) 118/92 115/87 116/71  Pulse: (!) 112  (!) 141 (!) 111  Resp:  18 20 13   Temp:  99.4 F (37.4 C) 99.6 F (37.6 C) 99.2 F (37.3 C)  TempSrc:  Oral Oral Oral  SpO2: 93%  95% 95%  Weight:  66 kg    Height:        Constitutional: NAD, calm, comfortable Vitals:   07/01/23 0455 07/01/23 0538 07/01/23 0728 07/01/23 1057  BP:  (!) 118/92 115/87 116/71  Pulse: (!) 112  (!) 141 (!) 111  Resp:  18 20 13   Temp:  99.4 F (37.4 C) 99.6 F (37.6 C) 99.2 F (37.3 C)  TempSrc:  Oral Oral Oral  SpO2: 93%  95% 95%  Weight:  66 kg    Height:       Eyes: PERRL, lids and conjunctivae normal ENMT: Mucous membranes are dry.  Posterior pharynx clear of any exudate or lesions. Neck: normal, supple, no masses, no thyromegaly Respiratory: bilateral decreased breath sounds at bases, no wheezing, no crackles. Normal respiratory effort. No accessory muscle use.  Cardiovascular: S1 S2 positive, tachycardic; no extremity edema. 2+ pedal pulses.  Abdomen: Diffuse abdominal tenderness present, no rebound tenderness, no masses palpated. No hepatosplenomegaly. Bowel sounds positive.  Musculoskeletal: no clubbing / cyanosis. No joint deformity upper and lower extremities.  Skin: no rashes, lesions, ulcers. No induration Neurologic: CN 2-12 grossly intact. Moving extremities. No focal neurologic deficits.  Psychiatric: Normal judgment and insight. Alert and oriented x 3. Normal mood.    Labs on Admission: I have personally reviewed  following labs and imaging studies  CBC: Recent Labs  Lab 06/30/23 2058 07/01/23 0923  WBC 15.2* 10.3  NEUTROABS  --  7.4  HGB 12.7 9.5*  HCT RESULTS UNAVAILABLE DUE TO INTERFERING SUBSTANCE 27.0*  MCV RESULTS UNAVAILABLE DUE TO INTERFERING SUBSTANCE 100.7*  PLT 212 156   Basic Metabolic Panel: Recent Labs  Lab 06/30/23 2058  NA 130*  K 4.0  CL 95*  CO2 18*  GLUCOSE 123*  BUN 34*  CREATININE 2.87*  CALCIUM 8.3*   GFR: Estimated Creatinine Clearance: 23.4 mL/min (A) (by C-G formula based on SCr of 2.87 mg/dL (H)). Liver Function Tests: Recent Labs  Lab 06/30/23 2058  AST 131*  ALT 56*  ALKPHOS 36*  BILITOT 10.9*  PROT 5.2*  ALBUMIN 2.7*   Recent Labs  Lab 06/30/23 2058  LIPASE 1,172*   No results for input(s): "AMMONIA" in the last 168  hours. Coagulation Profile: No results for input(s): "INR", "PROTIME" in the last 168 hours. Cardiac Enzymes: No results for input(s): "CKTOTAL", "CKMB", "CKMBINDEX", "TROPONINI" in the last 168 hours. BNP (last 3 results) No results for input(s): "PROBNP" in the last 8760 hours. HbA1C: No results for input(s): "HGBA1C" in the last 72 hours. CBG: Recent Labs  Lab 07/01/23 1034 07/01/23 1128 07/01/23 1227 07/01/23 1256 07/01/23 1351  GLUCAP 62* 94 69* 53* 128*   Lipid Profile: Recent Labs    07/01/23 0008  CHOL >1,000*  HDL NOT REPORTED DUE TO HIGH TRIGLYCERIDES  LDLCALC UNABLE TO CALCULATE IF TRIGLYCERIDE OVER 400 mg/dL  TRIG >1,610*  CHOLHDL NOT REPORTED DUE TO HIGH TRIGLYCERIDES  LDLDIRECT 122*   Thyroid Function Tests: No results for input(s): "TSH", "T4TOTAL", "FREET4", "T3FREE", "THYROIDAB" in the last 72 hours. Anemia Panel: No results for input(s): "VITAMINB12", "FOLATE", "FERRITIN", "TIBC", "IRON", "RETICCTPCT" in the last 72 hours. Urine analysis:    Component Value Date/Time   COLORURINE YELLOW 07/01/2023 0008   APPEARANCEUR CLEAR 07/01/2023 0008   LABSPEC 1.014 07/01/2023 0008   PHURINE 6.0  07/01/2023 0008   GLUCOSEU NEGATIVE 07/01/2023 0008   HGBUR NEGATIVE 07/01/2023 0008   BILIRUBINUR NEGATIVE 07/01/2023 0008   KETONESUR TRACE (A) 07/01/2023 0008   PROTEINUR NEGATIVE 07/01/2023 0008   UROBILINOGEN 0.2 04/26/2008 1327   NITRITE NEGATIVE 07/01/2023 0008   LEUKOCYTESUR NEGATIVE 07/01/2023 0008    Radiological Exams on Admission: CT ABDOMEN PELVIS WO CONTRAST  Result Date: 07/01/2023 CLINICAL DATA:  Extreme abdominal pain for 3 weeks. EXAM: CT ABDOMEN AND PELVIS WITHOUT CONTRAST TECHNIQUE: Multidetector CT imaging of the abdomen and pelvis was performed following the standard protocol without IV contrast. RADIATION DOSE REDUCTION: This exam was performed according to the departmental dose-optimization program which includes automated exposure control, adjustment of the mA and/or kV according to patient size and/or use of iterative reconstruction technique. COMPARISON:  None Available. FINDINGS: Lower chest: No acute abnormality. Hepatobiliary: Hepatic steatosis. Sludge within the gallbladder. No biliary dilation. Pancreas: Diffuse pancreatic edema with adjacent peripancreatic fluid. Fluid tracts inferiorly in the right anterior pararenal space into the pelvis. No organized collection. No ductal dilation. Spleen: Unremarkable. Adrenals/Urinary Tract: Normal adrenal glands. No urinary calculi or hydronephrosis. Unremarkable bladder. Stomach/Bowel: Wall thickening and stranding about the duodenum and right colon compatible with reactive duodenitis/colitis. Stomach is within normal limits. Stranding about the appendix is likely reactive. Dense stool in the colon. Vascular/Lymphatic: No significant vascular findings are present. No enlarged abdominal or pelvic lymph nodes. Reproductive: IUD in good position within the uterus. No adnexal mass. Other: No free intraperitoneal air. Musculoskeletal: No acute fracture. IMPRESSION: 1. Acute interstitial pancreatitis with adjacent peripancreatic fluid.  No organized collection. 2. Reactive duodenitis/colitis. 3. Hepatic steatosis. Electronically Signed   By: Minerva Fester M.D.   On: 07/01/2023 01:35     Assessment/Plan  Acute pancreatitis Hypertriglyceridemia and hyperlipidemia -Presented with acute pancreatitis with elevated lipase and imaging showing the same.  CT showed hepatic steatosis and sludging within the gallbladder with no biliary dilatation. -Triglycerides more than 5000, cholesterol more than 1000 and LDL 122. -Currently on insulin drip and IV dextrose.  Having episodes of hypoglycemia.  Change D5-D10.  Monitor triglycerides twice a day. -Clear liquid diet from later this afternoon.  Start fenofibrate if tolerating oral. -Continue analgesics and antiemetics as needed.  Possible duodenitis -IV Protonix.  AKI -Repeat labs this morning.  Continue IV fluids.  Hyponatremia -Monitor  Elevated LFTs Hepatic steatosis -Possibly from above and also from  alcohol use. --Counseled regarding abstinence from alcohol..  Monitor LFTs  Leukocytosis -Possible reactive.  Resolved  History of bipolar disorder -Resume home Vraylar  DVT prophylaxis: Lovenox Code Status: Full Family Communication: Husband at bedside Disposition Plan: Home in 2 to 3 days once clinically improved Consults called: None Admission status: Inpatient/progressive  Severity of Illness: The appropriate patient status for this patient is INPATIENT. Inpatient status is judged to be reasonable and necessary in order to provide the required intensity of service to ensure the patient's safety. The patient's presenting symptoms, physical exam findings, and initial radiographic and laboratory data in the context of their chronic comorbidities is felt to place them at high risk for further clinical deterioration. Furthermore, it is not anticipated that the patient will be medically stable for discharge from the hospital within 2 midnights of admission.   * I certify  that at the point of admission it is my clinical judgment that the patient will require inpatient hospital care spanning beyond 2 midnights from the point of admission due to high intensity of service, high risk for further deterioration and high frequency of surveillance required.Glade Lloyd MD [facility]  07/01/2023, 2:59 PM

## 2023-07-01 NOTE — TOC CM/SW Note (Signed)
Transition of Care [facility]) - Inpatient Brief Assessment   Patient Details  Name: Carly Thomas MRN: [id number] Date of Birth: 01-Mar-1981  Transition of Care [facility]) CM/SW Contact:    Darrold Span, RN Phone Number: 07/01/2023, 11:34 AM   Clinical Narrative: Admit with acute pancreatitis, currently on insulin drip. No anticipated transition needs. We will continue to monitor patient advancement through interdisciplinary progression rounds. If new patient transition needs arise, please place a TOC consult.   Transition of Care Asessment: Insurance and Status: Insurance coverage has been reviewed Patient has primary care physician: Yes Home environment has been reviewed: home Prior level of function:: independent Prior/Current Home Services: No current home services Social Determinants of Health Reivew: SDOH reviewed no interventions necessary Readmission risk has been reviewed: Yes Transition of care needs: no transition of care needs at this time

## 2023-07-02 DIAGNOSIS — K8591 Acute pancreatitis with uninfected necrosis, unspecified: Secondary | ICD-10-CM | POA: Diagnosis not present

## 2023-07-02 LAB — CBC WITH DIFFERENTIAL/PLATELET
Abs Immature Granulocytes: 0.06 10*3/uL (ref 0.00–0.07)
Basophils Absolute: 0 10*3/uL (ref 0.0–0.1)
Basophils Relative: 0 %
Eosinophils Absolute: 0.7 10*3/uL — ABNORMAL HIGH (ref 0.0–0.5)
Eosinophils Relative: 8 %
HCT: 24.9 % — ABNORMAL LOW (ref 36.0–46.0)
Hemoglobin: 8.4 g/dL — ABNORMAL LOW (ref 12.0–15.0)
Immature Granulocytes: 1 %
Lymphocytes Relative: 28 %
Lymphs Abs: 2.5 10*3/uL (ref 0.7–4.0)
MCH: 35 pg — ABNORMAL HIGH (ref 26.0–34.0)
MCHC: 33.7 g/dL (ref 30.0–36.0)
MCV: 103.8 fL — ABNORMAL HIGH (ref 80.0–100.0)
Monocytes Absolute: 0.4 10*3/uL (ref 0.1–1.0)
Monocytes Relative: 4 %
Neutro Abs: 5.3 10*3/uL (ref 1.7–7.7)
Neutrophils Relative %: 59 %
Platelets: 151 10*3/uL (ref 150–400)
RBC: 2.4 MIL/uL — ABNORMAL LOW (ref 3.87–5.11)
RDW: 14.4 % (ref 11.5–15.5)
WBC: 8.9 10*3/uL (ref 4.0–10.5)
nRBC: 0 % (ref 0.0–0.2)

## 2023-07-02 LAB — COMPREHENSIVE METABOLIC PANEL
ALT: 41 U/L (ref 0–44)
AST: 78 U/L — ABNORMAL HIGH (ref 15–41)
Albumin: 2.4 g/dL — ABNORMAL LOW (ref 3.5–5.0)
Alkaline Phosphatase: 225 U/L — ABNORMAL HIGH (ref 38–126)
Anion gap: 15 (ref 5–15)
BUN: <5 mg/dL — ABNORMAL LOW (ref 6–20)
CO2: 24 mmol/L (ref 22–32)
Calcium: 8.1 mg/dL — ABNORMAL LOW (ref 8.9–10.3)
Chloride: 95 mmol/L — ABNORMAL LOW (ref 98–111)
Creatinine, Ser: 0.63 mg/dL (ref 0.44–1.00)
GFR, Estimated: >60 mL/min (ref >60)
Glucose, Bld: 89 mg/dL (ref 70–99)
Potassium: 2.9 mmol/L — ABNORMAL LOW (ref 3.5–5.1)
Sodium: 134 mmol/L — ABNORMAL LOW (ref 135–145)
Total Bilirubin: 2.5 mg/dL — ABNORMAL HIGH (ref 0.3–1.2)
Total Protein: 5.4 g/dL — ABNORMAL LOW (ref 6.5–8.1)

## 2023-07-02 LAB — GLUCOSE, CAPILLARY
Glucose-Capillary: 101 mg/dL — ABNORMAL HIGH (ref 70–99)
Glucose-Capillary: 103 mg/dL — ABNORMAL HIGH (ref 70–99)
Glucose-Capillary: 104 mg/dL — ABNORMAL HIGH (ref 70–99)
Glucose-Capillary: 105 mg/dL — ABNORMAL HIGH (ref 70–99)
Glucose-Capillary: 109 mg/dL — ABNORMAL HIGH (ref 70–99)
Glucose-Capillary: 114 mg/dL — ABNORMAL HIGH (ref 70–99)
Glucose-Capillary: 170 mg/dL — ABNORMAL HIGH (ref 70–99)
Glucose-Capillary: 237 mg/dL — ABNORMAL HIGH (ref 70–99)
Glucose-Capillary: 67 mg/dL — ABNORMAL LOW (ref 70–99)
Glucose-Capillary: 84 mg/dL (ref 70–99)
Glucose-Capillary: 91 mg/dL (ref 70–99)

## 2023-07-02 LAB — LIPID PANEL
Cholesterol: 518 mg/dL — ABNORMAL HIGH (ref 0–200)
HDL: 18 mg/dL — ABNORMAL LOW (ref >40)
LDL Cholesterol: UNDETERMINED mg/dL (ref 0–99)
Total CHOL/HDL Ratio: 28.8 RATIO
Triglycerides: 535 mg/dL — ABNORMAL HIGH (ref <150)
VLDL: UNDETERMINED mg/dL (ref 0–40)

## 2023-07-02 LAB — TRIGLYCERIDES
Triglycerides: 404 mg/dL — ABNORMAL HIGH (ref <150)
Triglycerides: 425 mg/dL — ABNORMAL HIGH (ref <150)

## 2023-07-02 LAB — LDL CHOLESTEROL, DIRECT: Direct LDL: 303 mg/dL — ABNORMAL HIGH (ref 0–99)

## 2023-07-02 MED ORDER — POTASSIUM CHLORIDE CRYS ER 20 MEQ PO TBCR
40.0000 meq | EXTENDED_RELEASE_TABLET | ORAL | Status: AC
  Administered 2023-07-02 (×2): 40 meq via ORAL
  Filled 2023-07-02 (×2): qty 2

## 2023-07-02 MED ORDER — SODIUM CHLORIDE 0.9 % IV SOLN: INTRAVENOUS | Status: DC

## 2023-07-02 NOTE — Plan of Care (Signed)

## 2023-07-02 NOTE — Progress Notes (Signed)
PROGRESS NOTE    Carly Thomas  ZOX:[id number] DOB: May 30, 1981 DOA: 06/30/2023 PCP: Lewis Moccasin, MD   Brief Narrative:  42 y.o. female with medical history significant of bipolar disorder, bacterial vaginosis treated with antibiotics few months ago complicated by intermittent pelvic floor spasms presented with worsening abdominal pain for the last 2 to 3 weeks.  On presentation, Lipase was 1172, creatinine of 2.87, bicarb of 18 and triglycerides of more than 5000 with cholesterol of more than 1000 and WBC of 15.2. CT of abdomen showed acute interstitial pancreatitis without necrosis. She was started on IV fluids and analgesics.   Assessment & Plan:   Acute pancreatitis Hypertriglyceridemia and hyperlipidemia -Presented with acute pancreatitis with elevated lipase and imaging showing the same.  CT showed hepatic steatosis and sludging within the gallbladder with no biliary dilatation. -On presentation, triglycerides more than 5000, cholesterol more than 1000 and LDL 122. -Currently on insulin drip and IV dextrose.  Patient had issues with episodes of hypoglycemia on 07/01/2023. -Triglycerides improving: 535 this morning.  DC insulin drip.  Decrease D10 drip to 125 cc an hour.  Cholesterol improving to 518 and direct LDL of 303.  She has been started on fenofibrate.  Once LFTs improve, she will need to be started on statin as well. -She will need outpatient referral to lipid clinic.  I have spoken to Dr. Hilty/cardiology on phone regarding the same and he agrees with the same. -Currently on clear liquid diet.  Advance diet as tolerated. -Continue analgesics and antiemetics as needed.   Possible duodenitis -Continue IV Protonix.   AKI -Possibly from dehydration.  Resolved.  IV fluids plan as above.  Hyponatremia -Improving.  Monitor.  Elevated LFTs Hepatic steatosis -Possibly from above and also from alcohol use. --Counseled regarding abstinence from alcohol.  ALT has improved.   AST improving.  Total bilirubin improving as well.    Leukocytosis -Possible reactive.  Resolved   History of bipolar disorder -Continue home Vraylar   DVT prophylaxis: Lovenox Code Status: Full Family Communication: Sister and mother at bedside Disposition Plan: Status is: Inpatient Remains inpatient appropriate because: Of severity of illness    Consultants: None  Procedures: None  Antimicrobials: None   Subjective: Patient seen and examined at bedside.  Still complains of intermittent abdominal pain but improving.  Feels hungry.  No fever, chest pain or shortness of breath reported.  Objective: Vitals:   07/01/23 2012 07/01/23 2319 07/02/23 0316 07/02/23 0707  BP: 104/78 111/77 112/80 107/76  Pulse: (!) 109 (!) 107 (!) 110 100  Resp: 19 17 16 16   Temp: 99.6 F (37.6 C) 99.1 F (37.3 C) 99.3 F (37.4 C) 98.5 F (36.9 C)  TempSrc: Oral Oral Oral Oral  SpO2: 96% 96% 95% 98%  Weight:      Height:        Intake/Output Summary (Last 24 hours) at 07/02/2023 0737 Last data filed at 07/02/2023 0408 Gross per 24 hour  Intake 3838.03 ml  Output --  Net 3838.03 ml   Filed Weights   06/30/23 2053 07/01/23 0538  Weight: 65.3 kg 66 kg    Examination:  General exam: Appears calm and comfortable.  On room air. Respiratory system: Bilateral decreased breath sounds at bases Cardiovascular system: S1 & S2 heard, Rate controlled Gastrointestinal system: Abdomen is distended, soft and mildly tender.  Normal bowel sounds heard. Extremities: No cyanosis, clubbing, edema  Central nervous system: Alert and oriented. No focal neurological deficits. Moving extremities Skin: No rashes, lesions  or ulcers Psychiatry: Judgement and insight appear normal. Mood & affect appropriate.     Data Reviewed: I have personally reviewed following labs and imaging studies  CBC: Recent Labs  Lab 06/30/23 2058 07/01/23 0923 07/02/23 0453  WBC 15.2* 10.3 8.9  NEUTROABS  --  7.4 5.3   HGB 12.7 9.5* 8.4*  HCT RESULTS UNAVAILABLE DUE TO INTERFERING SUBSTANCE 27.0* 24.9*  MCV RESULTS UNAVAILABLE DUE TO INTERFERING SUBSTANCE 100.7* 103.8*  PLT 212 156 151   Basic Metabolic Panel: Recent Labs  Lab 06/30/23 2058 07/01/23 1325 07/02/23 0453  NA 130* 129* 134*  K 4.0 3.7 2.9*  CL 95* 97* 95*  CO2 18* 20* 24  GLUCOSE 123* 183* 89  BUN 34* <5* <5*  CREATININE 2.87* 0.74 0.63  CALCIUM 8.3* 7.5* 8.1*   GFR: Estimated Creatinine Clearance: 83.9 mL/min (by C-G formula based on SCr of 0.63 mg/dL). Liver Function Tests: Recent Labs  Lab 06/30/23 2058 07/01/23 1325 07/02/23 0453  AST 131* 95* 78*  ALT 56* 33 41  ALKPHOS 36* 224* 225*  BILITOT 10.9* 3.0* 2.5*  PROT 5.2* 4.6* 5.4*  ALBUMIN 2.7* 2.2* 2.4*   Recent Labs  Lab 06/30/23 2058  LIPASE 1,172*   No results for input(s): "AMMONIA" in the last 168 hours. Coagulation Profile: No results for input(s): "INR", "PROTIME" in the last 168 hours. Cardiac Enzymes: No results for input(s): "CKTOTAL", "CKMB", "CKMBINDEX", "TROPONINI" in the last 168 hours. BNP (last 3 results) No results for input(s): "PROBNP" in the last 8760 hours. HbA1C: No results for input(s): "HGBA1C" in the last 72 hours. CBG: Recent Labs  Lab 07/02/23 0320 07/02/23 0407 07/02/23 0457 07/02/23 0604 07/02/23 0707  GLUCAP 101* 109* 84 114* 91   Lipid Profile: Recent Labs    07/01/23 0008 07/01/23 1325 07/01/23 2144 07/02/23 0453  CHOL >1,000*  --   --  518*  HDL NOT REPORTED DUE TO HIGH TRIGLYCERIDES  --   --  18*  LDLCALC UNABLE TO CALCULATE IF TRIGLYCERIDE OVER 400 mg/dL  --   --  UNABLE TO CALCULATE IF TRIGLYCERIDE OVER 400 mg/dL  TRIG >0,865*   < > 784* 535*  CHOLHDL NOT REPORTED DUE TO HIGH TRIGLYCERIDES  --   --  28.8  LDLDIRECT 122*  --   --  303*   < > = values in this interval not displayed.   Thyroid Function Tests: No results for input(s): "TSH", "T4TOTAL", "FREET4", "T3FREE", "THYROIDAB" in the last 72  hours. Anemia Panel: No results for input(s): "VITAMINB12", "FOLATE", "FERRITIN", "TIBC", "IRON", "RETICCTPCT" in the last 72 hours. Sepsis Labs: No results for input(s): "PROCALCITON", "LATICACIDVEN" in the last 168 hours.  No results found for this or any previous visit (from the past 240 hour(s)).       Radiology Studies: CT ABDOMEN PELVIS WO CONTRAST  Result Date: 07/01/2023 CLINICAL DATA:  Extreme abdominal pain for 3 weeks. EXAM: CT ABDOMEN AND PELVIS WITHOUT CONTRAST TECHNIQUE: Multidetector CT imaging of the abdomen and pelvis was performed following the standard protocol without IV contrast. RADIATION DOSE REDUCTION: This exam was performed according to the departmental dose-optimization program which includes automated exposure control, adjustment of the mA and/or kV according to patient size and/or use of iterative reconstruction technique. COMPARISON:  None Available. FINDINGS: Lower chest: No acute abnormality. Hepatobiliary: Hepatic steatosis. Sludge within the gallbladder. No biliary dilation. Pancreas: Diffuse pancreatic edema with adjacent peripancreatic fluid. Fluid tracts inferiorly in the right anterior pararenal space into the pelvis. No organized collection. No  ductal dilation. Spleen: Unremarkable. Adrenals/Urinary Tract: Normal adrenal glands. No urinary calculi or hydronephrosis. Unremarkable bladder. Stomach/Bowel: Wall thickening and stranding about the duodenum and right colon compatible with reactive duodenitis/colitis. Stomach is within normal limits. Stranding about the appendix is likely reactive. Dense stool in the colon. Vascular/Lymphatic: No significant vascular findings are present. No enlarged abdominal or pelvic lymph nodes. Reproductive: IUD in good position within the uterus. No adnexal mass. Other: No free intraperitoneal air. Musculoskeletal: No acute fracture. IMPRESSION: 1. Acute interstitial pancreatitis with adjacent peripancreatic fluid. No organized  collection. 2. Reactive duodenitis/colitis. 3. Hepatic steatosis. Electronically Signed   By: Minerva Fester M.D.   On: 07/01/2023 01:35        Scheduled Meds:  cariprazine  3 mg Oral QHS   fenofibrate  160 mg Oral Daily   heparin  5,000 Units Subcutaneous Q8H   pantoprazole (PROTONIX) IV  40 mg Intravenous Q24H   potassium chloride  40 mEq Oral Q4H   Continuous Infusions:  dextrose 200 mL/hr at 07/02/23 0501          Glade Lloyd, MD [facility] 07/02/2023, 7:37 AM

## 2023-07-03 DIAGNOSIS — K8591 Acute pancreatitis with uninfected necrosis, unspecified: Secondary | ICD-10-CM | POA: Diagnosis not present

## 2023-07-03 LAB — COMPREHENSIVE METABOLIC PANEL
ALT: 50 U/L — ABNORMAL HIGH (ref 0–44)
AST: 89 U/L — ABNORMAL HIGH (ref 15–41)
Albumin: 2.6 g/dL — ABNORMAL LOW (ref 3.5–5.0)
Alkaline Phosphatase: 233 U/L — ABNORMAL HIGH (ref 38–126)
Anion gap: 8 (ref 5–15)
BUN: <5 mg/dL — ABNORMAL LOW (ref 6–20)
CO2: 26 mmol/L (ref 22–32)
Calcium: 8.3 mg/dL — ABNORMAL LOW (ref 8.9–10.3)
Chloride: 106 mmol/L (ref 98–111)
Creatinine, Ser: 0.62 mg/dL (ref 0.44–1.00)
GFR, Estimated: >60 mL/min (ref >60)
Glucose, Bld: 111 mg/dL — ABNORMAL HIGH (ref 70–99)
Potassium: 3.5 mmol/L (ref 3.5–5.1)
Sodium: 140 mmol/L (ref 135–145)
Total Bilirubin: 2.4 mg/dL — ABNORMAL HIGH (ref 0.3–1.2)
Total Protein: 6.1 g/dL — ABNORMAL LOW (ref 6.5–8.1)

## 2023-07-03 LAB — CBC WITH DIFFERENTIAL/PLATELET
Abs Immature Granulocytes: 0.04 10*3/uL (ref 0.00–0.07)
Basophils Absolute: 0 10*3/uL (ref 0.0–0.1)
Basophils Relative: 1 %
Eosinophils Absolute: 0.5 10*3/uL (ref 0.0–0.5)
Eosinophils Relative: 7 %
HCT: 26.8 % — ABNORMAL LOW (ref 36.0–46.0)
Hemoglobin: 8.9 g/dL — ABNORMAL LOW (ref 12.0–15.0)
Immature Granulocytes: 1 %
Lymphocytes Relative: 30 %
Lymphs Abs: 2.2 10*3/uL (ref 0.7–4.0)
MCH: 34.5 pg — ABNORMAL HIGH (ref 26.0–34.0)
MCHC: 33.2 g/dL (ref 30.0–36.0)
MCV: 103.9 fL — ABNORMAL HIGH (ref 80.0–100.0)
Monocytes Absolute: 0.2 10*3/uL (ref 0.1–1.0)
Monocytes Relative: 3 %
Neutro Abs: 4.3 10*3/uL (ref 1.7–7.7)
Neutrophils Relative %: 58 %
Platelets: 160 10*3/uL (ref 150–400)
RBC: 2.58 MIL/uL — ABNORMAL LOW (ref 3.87–5.11)
RDW: 14.7 % (ref 11.5–15.5)
Smear Review: DECREASED
WBC: 7.3 10*3/uL (ref 4.0–10.5)
nRBC: 0 % (ref 0.0–0.2)

## 2023-07-03 LAB — GLUCOSE, CAPILLARY
Glucose-Capillary: 100 mg/dL — ABNORMAL HIGH (ref 70–99)
Glucose-Capillary: 93 mg/dL (ref 70–99)
Glucose-Capillary: 101 mg/dL — ABNORMAL HIGH (ref 70–99)

## 2023-07-03 LAB — TRIGLYCERIDES: Triglycerides: 565 mg/dL — ABNORMAL HIGH (ref <150)

## 2023-07-03 LAB — MAGNESIUM: Magnesium: 1.7 mg/dL (ref 1.7–2.4)

## 2023-07-03 MED ORDER — FENOFIBRATE 160 MG PO TABS: 160.0000 mg | ORAL_TABLET | Freq: Every day | ORAL | 1 refills | Status: DC

## 2023-07-03 MED ORDER — ONDANSETRON HCL 4 MG PO TABS: 4.0000 mg | ORAL_TABLET | Freq: Four times a day (QID) | ORAL | 0 refills | Status: DC | PRN

## 2023-07-03 MED ORDER — HYDROCODONE-ACETAMINOPHEN 5-325 MG PO TABS: 1.0000 | ORAL_TABLET | Freq: Four times a day (QID) | ORAL | 0 refills | Status: DC | PRN

## 2023-07-03 MED ORDER — OMEPRAZOLE 40 MG PO CPDR: 40.0000 mg | DELAYED_RELEASE_CAPSULE | Freq: Every day | ORAL | 0 refills | Status: DC

## 2023-07-03 NOTE — Discharge Summary (Signed)
Physician Discharge Summary  TENNIE RABOLD MWU:[id number] DOB: 12-14-1980 DOA: 06/30/2023  PCP: Lewis Moccasin, MD  Admit date: 06/30/2023 Discharge date: 07/03/2023  Admitted From: Home Disposition: Home  Recommendations for Outpatient Follow-up:  Follow up with PCP in 1 week with repeat CBC/BMP Follow up in ED if symptoms worsen or new appear   Home Health: No Equipment/Devices: None  Discharge Condition: Stable CODE STATUS: Full Diet recommendation: Heart healthy/soft diet  Brief/Interim Summary: 42 y.o. female with medical history significant of bipolar disorder, bacterial vaginosis treated with antibiotics few months ago complicated by intermittent pelvic floor spasms presented with worsening abdominal pain for the last 2 to 3 weeks.  On presentation, Lipase was 1172, creatinine of 2.87, bicarb of 18 and triglycerides of more than 5000 with cholesterol of more than 1000 and WBC of 15.2. CT of abdomen showed acute interstitial pancreatitis without necrosis. She was started on insulin drip, IV fluids and analgesics.  During the hospitalization, her condition has improved.  Diet has been advanced and she is tolerating it.  Triglycerides have improved to around 500.  Insulin drip has been discontinued.  She has been started on fenofibrate.  She feels better enough to go home today.  She will be discharged today with outpatient follow-up with PCP and oncology.  Discharge Diagnoses:   Acute pancreatitis Hypertriglyceridemia and hyperlipidemia -Presented with acute pancreatitis with elevated lipase and imaging showing the same.  CT showed hepatic steatosis and sludging within the gallbladder with no biliary dilatation. -On presentation, triglycerides more than 5000, cholesterol more than 1000 and LDL 122. -Treated with insulin drip and IV dextrose.  Insulin drip discontinued on 07/02/2023 after triglycerides improved to around 500. -She has been started on fenofibrate.  Once LFTs  improve, she will need to be started on statin as well.  This will happen as an outpatient. -She will need outpatient referral to lipid clinic.  I have spoken to Dr. Hilty/cardiology on phone on 07/02/2023 regarding the same and he agrees with the same.  Office follow-up with Dr. Rennis Golden  has been set up for 07/07/2023. -Feels much better and is tolerating advanced diet with intermittent cramping.  Feels a bit bloated.  Discharge patient home today on oral fenofibrate and some as needed analgesics and antiemetics.  Outpatient follow-up with PCP with repeat LFTs in a week.   Possible duodenitis -Continue oral Protonix on discharge.  AKI -Possibly from dehydration.  Resolved.  Treated with IV fluids.   Hyponatremia -Resolved   Elevated LFTs Hepatic steatosis -Possibly from above and also from alcohol use. --Counseled regarding abstinence from alcohol.  AST and ALT are mildly elevated still.  Total bilirubin improving as well.  Outpatient follow-up of LFTs.  Macrocytic anemia -Possibly from alcohol abuse.  Hemoglobin stable.  Outpatient follow-up.   Leukocytosis -Possible reactive.  Resolved   History of bipolar disorder -Continue home Vraylar  Discharge Instructions  Discharge Instructions     Diet general   Complete by: As directed    Soft diet   Increase activity slowly   Complete by: As directed       Allergies as of 07/03/2023   No Known Allergies      Medication List     STOP taking these medications    acetaminophen 500 MG tablet Commonly known as: TYLENOL   diclofenac 75 MG EC tablet Commonly known as: VOLTAREN   DULoxetine 20 MG capsule Commonly known as: CYMBALTA   gabapentin 100 MG capsule Commonly known as: NEURONTIN  lidocaine 5 % ointment Commonly known as: XYLOCAINE   ranitidine 300 MG tablet Commonly known as: ZANTAC       TAKE these medications    diazepam 10 MG tablet Commonly known as: VALIUM 10 mg See admin instructions. Insert 1  tablet into the vagina 1 hour before bedtime as needed for pelvic floor spasm.   famotidine 40 MG tablet Commonly known as: PEPCID Take 40 mg by mouth daily as needed for heartburn.   fenofibrate 160 MG tablet Take 1 tablet (160 mg total) by mouth daily.   folic acid 1 MG tablet Commonly known as: FOLVITE Take 1 mg by mouth daily.   HYDROcodone-acetaminophen 5-325 MG tablet Commonly known as: NORCO/VICODIN Take 1 tablet by mouth every 6 (six) hours as needed for moderate pain.   omeprazole 40 MG capsule Commonly known as: PRILOSEC Take 1 capsule (40 mg total) by mouth daily.   ondansetron 4 MG tablet Commonly known as: ZOFRAN Take 1 tablet (4 mg total) by mouth every 6 (six) hours as needed for nausea.   Vraylar 3 MG capsule Generic drug: cariprazine Take 3 mg by mouth at bedtime.        Follow-up Information     Hilty, Lisette Abu, MD Follow up.   Specialty: Cardiology Why: Keep scheduled appointment on 07/07/23. Call office for specifics Contact information: [address] [place] [address] [phone]         Lewis Moccasin, MD. Schedule an appointment as soon as possible for a visit in 1 week(s).   Specialty: Family Medicine Why: with repeat cmp Contact information: [address] [place] [address] [phone]                No Known Allergies  Consultations: None   Procedures/Studies: CT ABDOMEN PELVIS WO CONTRAST  Result Date: 07/01/2023 CLINICAL DATA:  Extreme abdominal pain for 3 weeks. EXAM: CT ABDOMEN AND PELVIS WITHOUT CONTRAST TECHNIQUE: Multidetector CT imaging of the abdomen and pelvis was performed following the standard protocol without IV contrast. RADIATION DOSE REDUCTION: This exam was performed according to the departmental dose-optimization program which includes automated exposure control, adjustment of the mA and/or kV according to patient size and/or use of iterative reconstruction technique. COMPARISON:   None Available. FINDINGS: Lower chest: No acute abnormality. Hepatobiliary: Hepatic steatosis. Sludge within the gallbladder. No biliary dilation. Pancreas: Diffuse pancreatic edema with adjacent peripancreatic fluid. Fluid tracts inferiorly in the right anterior pararenal space into the pelvis. No organized collection. No ductal dilation. Spleen: Unremarkable. Adrenals/Urinary Tract: Normal adrenal glands. No urinary calculi or hydronephrosis. Unremarkable bladder. Stomach/Bowel: Wall thickening and stranding about the duodenum and right colon compatible with reactive duodenitis/colitis. Stomach is within normal limits. Stranding about the appendix is likely reactive. Dense stool in the colon. Vascular/Lymphatic: No significant vascular findings are present. No enlarged abdominal or pelvic lymph nodes. Reproductive: IUD in good position within the uterus. No adnexal mass. Other: No free intraperitoneal air. Musculoskeletal: No acute fracture. IMPRESSION: 1. Acute interstitial pancreatitis with adjacent peripancreatic fluid. No organized collection. 2. Reactive duodenitis/colitis. 3. Hepatic steatosis. Electronically Signed   By: Minerva Fester M.D.   On: 07/01/2023 01:35      Subjective: Patient seen and examined at bedside.  Still complains of intermittent abdominal cramping, mostly after food.  Tolerated diet last night and this morning.  Feels okay to go home today.  No fever or vomiting reported.  Discharge Exam: Vitals:   07/03/23 0303 07/03/23 0305  BP:  115/84  Pulse:  97  Resp:  20  Temp: 98.1 F (36.7 C) 98.1 F (36.7 C)  SpO2:  99%    General: Pt is alert, awake, not in acute distress.  On room air. Cardiovascular: rate controlled, S1/S2 + Respiratory: bilateral decreased breath sounds at bases Abdominal: Soft, mild epigastric tenderness present, ND, bowel sounds + Extremities: no edema, no cyanosis    The results of significant diagnostics from this hospitalization (including  imaging, microbiology, ancillary and laboratory) are listed below for reference.     Microbiology: No results found for this or any previous visit (from the past 240 hour(s)).   Labs: BNP (last 3 results) No results for input(s): "BNP" in the last 8760 hours. Basic Metabolic Panel: Recent Labs  Lab 06/30/23 2058 07/01/23 1325 07/02/23 0453 07/03/23 0826  NA 130* 129* 134* 140  K 4.0 3.7 2.9* 3.5  CL 95* 97* 95* 106  CO2 18* 20* 24 26  GLUCOSE 123* 183* 89 111*  BUN 34* <5* <5* <5*  CREATININE 2.87* 0.74 0.63 0.62  CALCIUM 8.3* 7.5* 8.1* 8.3*  MG  --   --   --  1.7   Liver Function Tests: Recent Labs  Lab 06/30/23 2058 07/01/23 1325 07/02/23 0453 07/03/23 0826  AST 131* 95* 78* 89*  ALT 56* 33 41 50*  ALKPHOS 36* 224* 225* 233*  BILITOT 10.9* 3.0* 2.5* 2.4*  PROT 5.2* 4.6* 5.4* 6.1*  ALBUMIN 2.7* 2.2* 2.4* 2.6*   Recent Labs  Lab 06/30/23 2058  LIPASE 1,172*   No results for input(s): "AMMONIA" in the last 168 hours. CBC: Recent Labs  Lab 06/30/23 2058 07/01/23 0923 07/02/23 0453 07/03/23 0826  WBC 15.2* 10.3 8.9 7.3  NEUTROABS  --  7.4 5.3 PENDING  HGB 12.7 9.5* 8.4* 8.9*  HCT RESULTS UNAVAILABLE DUE TO INTERFERING SUBSTANCE 27.0* 24.9* 26.8*  MCV RESULTS UNAVAILABLE DUE TO INTERFERING SUBSTANCE 100.7* 103.8* 103.9*  PLT 212 156 151 160   Cardiac Enzymes: No results for input(s): "CKTOTAL", "CKMB", "CKMBINDEX", "TROPONINI" in the last 168 hours. BNP: Invalid input(s): "POCBNP" CBG: Recent Labs  Lab 07/02/23 1129 07/02/23 1514 07/02/23 1929 07/03/23 0305 07/03/23 0812  GLUCAP 237* 170* 105* 100* 93   D-Dimer No results for input(s): "DDIMER" in the last 72 hours. Hgb A1c No results for input(s): "HGBA1C" in the last 72 hours. Lipid Profile Recent Labs    07/01/23 0008 07/01/23 1325 07/02/23 0453 07/02/23 1523 07/02/23 2017 07/03/23 0826  CHOL >1,000*  --  518*  --   --   --   HDL NOT REPORTED DUE TO HIGH TRIGLYCERIDES  --  18*  --    --   --   LDLCALC UNABLE TO CALCULATE IF TRIGLYCERIDE OVER 400 mg/dL  --  UNABLE TO CALCULATE IF TRIGLYCERIDE OVER 400 mg/dL  --   --   --   TRIG >1,610*   < > 535*   < > 425* 565*  CHOLHDL NOT REPORTED DUE TO HIGH TRIGLYCERIDES  --  28.8  --   --   --   LDLDIRECT 122*  --  303*  --   --   --    < > = values in this interval not displayed.   Thyroid function studies No results for input(s): "TSH", "T4TOTAL", "T3FREE", "THYROIDAB" in the last 72 hours.  Invalid input(s): "FREET3" Anemia work up No results for input(s): "VITAMINB12", "FOLATE", "FERRITIN", "TIBC", "IRON", "RETICCTPCT" in the last 72 hours. Urinalysis    Component Value Date/Time  COLORURINE YELLOW 07/01/2023 0008   APPEARANCEUR CLEAR 07/01/2023 0008   LABSPEC 1.014 07/01/2023 0008   PHURINE 6.0 07/01/2023 0008   GLUCOSEU NEGATIVE 07/01/2023 0008   HGBUR NEGATIVE 07/01/2023 0008   BILIRUBINUR NEGATIVE 07/01/2023 0008   KETONESUR TRACE (A) 07/01/2023 0008   PROTEINUR NEGATIVE 07/01/2023 0008   UROBILINOGEN 0.2 04/26/2008 1327   NITRITE NEGATIVE 07/01/2023 0008   LEUKOCYTESUR NEGATIVE 07/01/2023 0008   Sepsis Labs Recent Labs  Lab 06/30/23 2058 07/01/23 0923 07/02/23 0453 07/03/23 0826  WBC 15.2* 10.3 8.9 7.3   Microbiology No results found for this or any previous visit (from the past 240 hour(s)).   Time coordinating discharge: 35 minutes  SIGNED:   Glade Lloyd, MD  [facility] 07/03/2023, 10:00 AM

## 2023-07-03 NOTE — Progress Notes (Signed)
DC instructions and med list reviewed with pt and SO.  Both verbalized understanding.  Tele and IV removed.  New RX already delivered to pt's home by their pharmacy.  Pt to follow up next week with PCP for repeat bloodwork.  Written info provided on new medications and pancreatitis.  All belongings returned.  Pt Dc'd via wheelchair without incident

## 2023-07-07 ENCOUNTER — Encounter (HOSPITAL_BASED_OUTPATIENT_CLINIC_OR_DEPARTMENT_OTHER): Payer: Self-pay | Admitting: Internal Medicine

## 2023-07-07 ENCOUNTER — Ambulatory Visit (INDEPENDENT_AMBULATORY_CARE_PROVIDER_SITE_OTHER): Payer: Managed Care, Other (non HMO) | Admitting: Internal Medicine

## 2023-07-07 VITALS — BP 118/68 | HR 94 | Ht 64.0 in | Wt 154.2 lb

## 2023-07-07 DIAGNOSIS — E783 Hyperchylomicronemia: Secondary | ICD-10-CM

## 2023-07-07 DIAGNOSIS — K8591 Acute pancreatitis with uninfected necrosis, unspecified: Secondary | ICD-10-CM | POA: Diagnosis not present

## 2023-07-07 NOTE — Patient Instructions (Addendum)
Medication Instructions:  Continue current medications  *If you need a refill on your cardiac medications before your next appointment, please call your pharmacy*   Lab Work: Fasting NMR, LP(a), and Direct LDL  If you have labs (blood work) drawn today and your tests are completely normal, you will receive your results only by: MyChart Message (if you have MyChart) OR A paper copy in the mail If you have any lab test that is abnormal or we need to change your treatment, we will call you to review the results.   Testing/Procedures: None Ordered   Follow-Up: At [facility], you and your health needs are our priority.  As part of our continuing mission to provide you with exceptional heart care, we have created designated Provider Care Teams.  These Care Teams include your primary Cardiologist (physician) and Advanced Practice Providers (APPs -  Physician Assistants and Nurse Practitioners) who all work together to provide you with the care you need, when you need it.  We recommend signing up for the patient portal called "MyChart".  Sign up information is provided on this After Visit Summary.  MyChart is used to connect with patients for Virtual Visits (Telemedicine).  Patients are able to view lab/test results, encounter notes, upcoming appointments, etc.  Non-urgent messages can be sent to your provider as well.   To learn more about what you can do with MyChart, go to [email or web address].    Your next appointment:   3-4 month(s)  Provider:   K. Italy Hilty, MD    Other Instructions  Acute Pancreatitis  Acute pancreatitis happens when there is sudden swelling and irritation of the pancreas. The pancreas is a gland in your body that helps to control blood sugar. This gland also helps to digest food. This condition can last a few days and cause serious problems. Some problems can be life-threatening. The lungs, heart, and kidneys may stop working. What are the  causes? Causes may include: Heavy alcohol use. Drug use. Gallstones. An abnormal growth of tissue (tumor) in the pancreas. Other causes include: Some medicines or some chemicals. Diabetes or infection. High levels of a type of fat in your blood. High levels of calcium in your blood. Damage caused by: An accident. The poison (venom) from a scorpion sting. Belly (abdominal) surgery. The body's defense system (immune system) attacking the pancreas (autoimmune pancreatitis). Genes that are passed from parent to child (inherited). Sometimes, the cause is not known. What are the signs or symptoms? Pain in the upper belly that may be felt in the back. The pain may be very bad. It often gets worse after you eat. A tender and swollen belly. Feeling like you may vomit (nausea) and vomiting. Fever. How is this treated? A stay in the hospital, in many cases. Pain medicine. Fluid through an IV tube. Placing a tube in the stomach to take out the stomach contents. This also helps you stop vomiting. Not eating until you vomit less. Antibiotic medicines, if you have an infection. Steroid medicines, if your problem is caused by attacks on your body's own tissues by your defense system. Surgery, if your problem is caused by gallstones or other blockage. Treating other health problems that may be the cause. Follow these instructions at home: Medicines Take over-the-counter and prescription medicines only as told by your doctor. If you were prescribed an antibiotic medicine, take it as told by your doctor. Do not stop taking it even if you start to feel better. If told, take  steps to prevent problems with pooping (constipation). You may need to: Take medicines. You will be told what medicines to take. Eat foods that are high in fiber. These include beans, whole grains, and fresh fruits and vegetables. Limit foods that are high in fat and sugar. These include fried or sweet foods. Ask your doctor  if you should avoid driving or using machines while you are taking your medicine. Eating and drinking  Follow instructions from your doctor about what to eat and drink. You may need to: Avoid alcohol. Eat foods that do not have a lot of fat in them. Eat small meals often. Do not eat big meals. Drink enough fluid to keep your pee (urine) pale yellow. Do not drink alcohol if it caused your condition. General instructions Do not smoke or use any products that contain nicotine or tobacco. If you need help quitting, ask your doctor. Get plenty of rest. Check your blood sugar at home if your doctor tells you to. Keep all follow-up visits. Contact a doctor if: You do not get better as fast as expected. Your symptoms get worse. You have new symptoms. You have pain or weakness that lasts a long time. You keep feeling like you may vomit. You get better and then pain comes back. You have a fever. Get help right away if: You vomit every time you eat or drink. Your pain gets very bad. Your skin or the white parts of your eyes turn yellow. You have sudden swelling in your belly. You feel dizzy or you faint. Your blood sugar is high (over 300 mg/dL). You vomit blood. These symptoms may be an emergency. Do not wait to see if the symptoms will go away. Get help right away. Call 911. Summary Acute pancreatitis happens when there is sudden swelling and irritation of the pancreas. This condition is often caused by heavy alcohol use, drug use, or gallstones. You will likely have to stay in the hospital for treatment. This information is not intended to replace advice given to you by your health care provider. Make sure you discuss any questions you have with your health care provider. Document Revised: 09/17/2021 Document Reviewed: 09/17/2021 Elsevier Patient Education  2024 [employer].

## 2023-07-07 NOTE — Progress Notes (Signed)
LIPID CLINIC CONSULT NOTE  Chief Complaint:  Hypertriglyceridemia, pancreatitis  Primary Care Physician: Lewis Moccasin, MD  Primary Cardiologist:  None  HPI:  Carly Thomas is a 42 y.o. female who is being seen today for the evaluation of hypertriglyceridemia and pancreatitis at the request of Lewis Moccasin, MD. this is a pleasant 41 year old female who I was contacted about recently after hospital admission for acute pancreatitis.  This was her first episode.  She does have a history of elevated cholesterol specifically high triglycerides in the past.  This apparently runs on the mom side of her family.  There are no reports however of other family members having had pancreatitis.  She initially presented with triglycerides over thousand and was treated with insulin with improvement of her triglycerides down into the 4-500s.  I was contacted about this and the patient was placed on fenofibrate and subsequently discharged about a week ago.  So far she is doing okay on the medicine however feels like she might be having some edema related to it.  I do not see where her pancreatic enzymes were followed closely and she reports that even on the day of discharge she was still having some pancreatic pain.  Of note her liver enzymes were also elevated including AST and ALT.  CT scan of the abdomen and pelvis showed acute interstitial pancreatitis but no evidence of any choledocholithiasis or gallstones.  Hepatic steatosis was also noted.  She reports she is tried to avoid a lower fat diet.  She has been having a periods of intermittent fasting up to 12 hours but her pain still persist.  She is requires pain medicine for this.  She was ready on some pre-existing pain medications for pelvic floor pain/spasm related to infection in the past.  PMHx:  Past Medical History:  Diagnosis Date   Depression    with 1st pregnancy   Headache(784.0)    migraines with 1st pregnancy   Heartburn in  pregnancy     Past Surgical History:  Procedure Laterality Date   CESAREAN SECTION     CESAREAN SECTION  03/19/2012   Procedure: CESAREAN SECTION;  Surgeon: Freddrick March. Tenny Craw, MD;  Location: [facility];  Service: Gynecology;  Laterality: N/A;  repeat     FAMHx:  Family History  Problem Relation Age of Onset   Spina bifida Maternal Aunt    Mental retardation Maternal Aunt    DiGeorge syndrome Other        Father of Baby's nephew    SOCHx:   reports that she has never smoked. She does not have any smokeless tobacco history on file. She reports that she does not drink alcohol and does not use drugs.  ALLERGIES:  No Known Allergies  ROS: Pertinent items noted in HPI and remainder of comprehensive ROS otherwise negative.  HOME MEDS: Current Outpatient Medications on File Prior to Visit  Medication Sig Dispense Refill   diazepam (VALIUM) 10 MG tablet 10 mg See admin instructions. Insert 1 tablet into the vagina 1 hour before bedtime as needed for pelvic floor spasm.     famotidine (PEPCID) 40 MG tablet Take 40 mg by mouth daily as needed for heartburn.     fenofibrate 160 MG tablet Take 1 tablet (160 mg total) by mouth daily. 30 tablet 1   folic acid (FOLVITE) 1 MG tablet Take 1 mg by mouth daily.     HYDROcodone-acetaminophen (NORCO/VICODIN) 5-325 MG tablet Take 1 tablet by mouth every  6 (six) hours as needed for moderate pain. 20 tablet 0   omeprazole (PRILOSEC) 40 MG capsule Take 1 capsule (40 mg total) by mouth daily. 30 capsule 0   ondansetron (ZOFRAN) 4 MG tablet Take 1 tablet (4 mg total) by mouth every 6 (six) hours as needed for nausea. 20 tablet 0   VRAYLAR 3 MG capsule Take 3 mg by mouth at bedtime.     No current facility-administered medications on file prior to visit.    LABS/IMAGING: No results found for this or any previous visit (from the past 48 hour(s)). No results found.  LIPID PANEL:    Component Value Date/Time   CHOL 518 (H) 07/02/2023 0453   TRIG 565 (H)  07/03/2023 0826   HDL 18 (L) 07/02/2023 0453   CHOLHDL 28.8 07/02/2023 0453   VLDL UNABLE TO CALCULATE IF TRIGLYCERIDE OVER 400 mg/dL 11/18/3233 5732   LDLCALC UNABLE TO CALCULATE IF TRIGLYCERIDE OVER 400 mg/dL 20/25/4270 6237   LDLDIRECT 303 (H) 07/02/2023 0453    WEIGHTS: Wt Readings from Last 3 Encounters:  07/07/23 154 lb 3.2 oz (69.9 kg)  07/01/23 145 lb 8.1 oz (66 kg)  10/13/13 145 lb (65.8 kg)    VITALS: BP 118/68 (BP Location: Left Arm, Patient Position: Sitting, Cuff Size: Normal)   Pulse 94   Ht 5\' 4"  (1.626 m)   Wt 154 lb 3.2 oz (69.9 kg)   SpO2 99%   BMI 26.47 kg/m   EXAM: Deferred  EKG: Deferred  ASSESSMENT: Acute pancreatitis, 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  She has been started on fenofibrate which should help substantially lower her triglycerides.  Standard of care would be also to be on statin therapy however she has had some issues with generalized pain and I would like not to introduce an additional medication at this point until we see how she responds to her fenofibrate.  In addition we provided some dietary recommendations to lower triglycerides.  I am not convinced that she has totally recovered from pancreatitis.  I have encouraged her to continue with dietary fasting, bland foods and primarily liquids.  Her PCP did get some lab work, hopefully repeated her pancreatic enzymes.  It may be worthwhile for her to be referred to a gastroenterologist for further evaluation.  I will plan repeat fasting lipids in about 3 months at that time will consider additional therapies.  Chrystie Nose, MD, [facility], FACP  [place]  [facility]  Medical Director of the Advanced Lipid Disorders &  Cardiovascular Risk Reduction  Clinic Diplomate of the American Board of Clinical Lipidology Attending Cardiologist  Direct Dial: [phone]  Fax: [phone]  Website:  [email or web address] Nicely Muhsin Doris 07/07/2023, 1:01 PM

## 2023-07-09 LAB — LAB REPORT - SCANNED: EGFR (Non-African Amer.): 119

## 2023-07-15 ENCOUNTER — Telehealth: Payer: Self-pay | Admitting: Internal Medicine

## 2023-07-15 MED ORDER — FENOFIBRATE 160 MG PO TABS: 160.0000 mg | ORAL_TABLET | Freq: Every day | ORAL | 5 refills | Status: DC

## 2023-07-15 NOTE — Telephone Encounter (Signed)
Chrystie Nose, MD  You2 minutes ago (3:09 PM)    Ok to continue fenofibrate - labs show continued elevated lipase- so that is active pancreatitis. Would try home fasting - clear liquids until pain resolves. Would benefit from GI evaluation. Mildly elevated liver enzymes. Plan repeat lipid testing in 3 months after starting meds.  Dr. Rexene Edison

## 2023-07-15 NOTE — Telephone Encounter (Signed)
Patient called w/info from MD. She has labs planned with PCP next week and tentative GI appt as well

## 2023-07-15 NOTE — Telephone Encounter (Signed)
Patient states she had labs done by PCP office. She would like you to review them and let her know if you need additional labs done. I see labs have been ordered she states she is confused on when you would like for her to get her labs done?

## 2023-07-15 NOTE — Telephone Encounter (Signed)
Spoke with patient. She had pancreatic enzymes/CBC/CMET done with PCP -- scanned.   Her AVS indicated she needed lipid testing but did not specify when. MD note said to do labs in about 3 months. She was just started on fenofibrate (refills sent for her). Advised labs are due end of Nov/early Dec at any [facility] - no appt needed.   Message has been sent to MD to review labs. Notified patient will call back if MD has other labs that are needed/changes now.

## 2023-07-15 NOTE — Telephone Encounter (Signed)
Patient states she had labs done by PCP.

## 2023-07-15 NOTE — Telephone Encounter (Signed)
Pt called in stating her PCP sent over labs and she wants to know if Dr. Rennis Golden needs any additional lab work. Lab work is scanned under [employer]

## 2023-10-03 LAB — LAB REPORT - SCANNED: EGFR: 92

## 2023-11-20 NOTE — Progress Notes (Signed)
 Cardiology Office Note:  .   Date:  11/25/2023  ID:  Carly Thomas, DOB 31-Dec-1980, MRN [id number] PCP: Carly Anda, MD  [facility] Providers Cardiologist:  None    Patient Profile: .      PMH Hypertriglyceridemia Pancreatitis Hepatic steatosis  Referred to Advanced Lipid Disorder clinic and seen by Dr. Maximo Spar on 07/07/2023 for hypertriglyceridemia. She had recently been admitted to the hospital for pancreatitis. This was her first episode.  She has a history of high triglycerides which also runs on her mom side of the family.  To her knowledge no other family members have had pancreatitis.  She initially presented with triglycerides over 1000 and was treated with insulin  with improvement of triglycerides down to the 4-500s.  She was placed on fenofibrate .  She reports possible edema since starting fenofibrate .  Her pancreatic enzymes remained elevated at discharge and she reported having some pancreatic pain.  CT of abdomen and pelvis showed acute interstitial pancreatitis but no evidence of cholelithiasis or gallstones.  Hepatic steatosis was noted. Labs completed 07/03/23 revealed glucose 111, albumin 2.6, AST 89, ALT 50, alkaline phosphatase 233 and total bilirubin 2.4 she reported trying to follow a low fat diet and had been intermittent fasting up to 12 hours but pain persisted.  She is requiring pain medicine for this and was also on pre-existing pain medicine for pelvic floor pain/spasm related to infection in the past.  She was encouraged to follow a bland diet.  For management of pancreatic pain.  Statin therapy was not started due to underlying pain.       History of Present Illness: .   Carly Thomas is a very pleasant 43 y.o. female who is here today for follow-up of hypertriglyceridemia. She reports significant lifestyle changes including a low-fat diet, increased fiber and water intake, reduced red meat consumption, and regular exercise. However, she admits to some  dietary indulgences over the holiday period. Has a history of anxiety, particularly in relation to medical visits and procedures. Despite this, she reports feeling generally better and has noticed some weight loss which is encouraging.  She is currently on fenofibrate  for cholesterol management, and medications for anxiety and depression. She denies chest pain, shortness of breath, lower extremity edema, fatigue, palpitations, weakness, presyncope, syncope, orthopnea, and PND. Her mother has history of ablation for an abnormal heart rhythm, unsure of what type.   Discussed the use of AI scribe software for clinical note transcription with the patient, who gave verbal consent to proceed.   ROS: See HPI       Studies Reviewed: .         Risk Assessment/Calculations:             Physical Exam:   VS:  BP 128/80 (BP Location: Right Arm, Patient Position: Sitting, Cuff Size: Normal)   Pulse (!) 103   Ht 5\' 4"  (1.626 m)   Wt 151 lb 9.6 oz (68.8 kg)   LMP  (Approximate)   SpO2 98%   BMI 26.02 kg/m    Wt Readings from Last 3 Encounters:  11/25/23 151 lb 9.6 oz (68.8 kg)  07/07/23 154 lb 3.2 oz (69.9 kg)  07/01/23 145 lb 8.1 oz (66 kg)    GEN: Well nourished, well developed in no acute distress NECK: No JVD; No carotid bruits CARDIAC: RRR, no murmurs, rubs, gallops RESPIRATORY:  Clear to auscultation without rales, wheezing or rhonchi  ABDOMEN: Soft, non-tender, non-distended EXTREMITIES:  No edema; No  deformity     ASSESSMENT AND PLAN: .    Hypertriglyceridemia: NMR collected 11/23/23  is still pending.  Lipid panel from 10/02/2023 revealed total cholesterol 178, HDL 86, LDL 79, and triglycerides 51.  Direct LDL from 11/23/2023 is 112.  She has had significant improvement in triglyceride levels with lifestyle modification.  We discussed ASCVD risk score which is less than 1%.  Discussed potential future need for statin therapy if LDL remains elevated and also potential future coronary  calcium score for further risk stratification.  Continue fenofibrate .  History of pancreatitis: Most recent triglyceride levels well below goal of < 150. No symptoms of pancreatitis.   I spent 35  minutes today with this patient including discussion about mixed hyperlipidemia, CV risk, and anxiety in review of records and recent labs, consideration of future appointments and testing, and documentation of my findings in the note.          Disposition: 6 months with Dr. Maximo Spar or me  Signed, Slater Duncan, NP-C

## 2023-11-25 ENCOUNTER — Encounter (HOSPITAL_BASED_OUTPATIENT_CLINIC_OR_DEPARTMENT_OTHER): Payer: Self-pay | Admitting: Nurse Practitioner

## 2023-11-25 ENCOUNTER — Ambulatory Visit (HOSPITAL_BASED_OUTPATIENT_CLINIC_OR_DEPARTMENT_OTHER): Payer: 59 | Admitting: Nurse Practitioner

## 2023-11-25 VITALS — BP 128/80 | HR 103 | Ht 64.0 in | Wt 151.6 lb

## 2023-11-25 DIAGNOSIS — E782 Mixed hyperlipidemia: Secondary | ICD-10-CM | POA: Diagnosis not present

## 2023-11-25 DIAGNOSIS — E783 Hyperchylomicronemia: Secondary | ICD-10-CM

## 2023-11-25 DIAGNOSIS — Z8719 Personal history of other diseases of the digestive system: Secondary | ICD-10-CM | POA: Diagnosis not present

## 2023-11-25 LAB — NMR, LIPOPROFILE
Cholesterol, Total: 236 mg/dL — ABNORMAL HIGH (ref 100–199)
HDL Particle Number: 48.2 umol/L (ref >=30.5)
HDL-C: 101 mg/dL (ref >39)
LDL Particle Number: 1278 nmol/L — ABNORMAL HIGH (ref <1000)
LDL Size: 21.2 nmol (ref >20.5)
LDL-C (NIH Calc): 123 mg/dL — ABNORMAL HIGH (ref 0–99)
LP-IR Score: 27 (ref <=45)
Small LDL Particle Number: <90 nmol/L (ref <=527)
Triglycerides: 74 mg/dL (ref 0–149)

## 2023-11-25 LAB — LDL CHOLESTEROL, DIRECT: LDL Direct: 112 mg/dL — ABNORMAL HIGH (ref 0–99)

## 2023-11-25 LAB — LIPOPROTEIN A (LPA): Lipoprotein (a): 53.2 nmol/L (ref <75.0)

## 2023-11-25 NOTE — Patient Instructions (Signed)
Medication Instructions:   Your physician recommends that you continue on your current medications as directed. Please refer to the Current Medication list given to you today.   *If you need a refill on your cardiac medications before your next appointment, please call your pharmacy*   Lab Work:  None ordered.   If you have labs (blood work) drawn today and your tests are completely normal, you will receive your results only by: MyChart Message (if you have MyChart) OR A paper copy in the mail If you have any lab test that is abnormal or we need to change your treatment, we will call you to review the results.   Testing/Procedures:  None ordered.   Follow-Up: At [facility], you and your health needs are our priority.  As part of our continuing mission to provide you with exceptional heart care, we have created designated Provider Care Teams.  These Care Teams include your primary Cardiologist (physician) and Advanced Practice Providers (APPs -  Physician Assistants and Nurse Practitioners) who all work together to provide you with the care you need, when you need it.  We recommend signing up for the patient portal called "MyChart".  Sign up information is provided on this After Visit Summary.  MyChart is used to connect with patients for Virtual Visits (Telemedicine).  Patients are able to view lab/test results, encounter notes, upcoming appointments, etc.  Non-urgent messages can be sent to your provider as well.   To learn more about what you can do with MyChart, go to [email or web address].    Your next appointment:   6 month(s)  Provider:   K. Italy Hilty, MD or Eligha Bridegroom, NP   Other Instructions  Your physician wants you to follow-up in: 6 months.  You will receive a reminder letter in the mail two months in advance. If you don't receive a letter, please call our office to schedule the follow-up appointment.

## 2023-11-26 ENCOUNTER — Encounter: Payer: Self-pay | Admitting: *Deleted

## 2023-11-27 ENCOUNTER — Telehealth (HOSPITAL_BASED_OUTPATIENT_CLINIC_OR_DEPARTMENT_OTHER): Payer: Self-pay | Admitting: Internal Medicine

## 2023-11-27 NOTE — Telephone Encounter (Signed)
 Patient called to follow up on her lab results.

## 2023-11-27 NOTE — Telephone Encounter (Signed)
Chrystie Nose, MD 11/25/2023  5:40 PM EST     Trigs are now normal!  LDL remains elevated, but improved to 112 from 303. LP(a) is negative   Dr H   Patient called w/results

## 2024-01-06 ENCOUNTER — Other Ambulatory Visit: Payer: Self-pay | Admitting: Internal Medicine

## 2024-03-09 ENCOUNTER — Ambulatory Visit (INDEPENDENT_AMBULATORY_CARE_PROVIDER_SITE_OTHER): Admitting: Podiatry

## 2024-03-09 DIAGNOSIS — Q6672 Congenital pes cavus, left foot: Secondary | ICD-10-CM | POA: Diagnosis not present

## 2024-03-09 DIAGNOSIS — Q667 Congenital pes cavus, unspecified foot: Secondary | ICD-10-CM

## 2024-03-09 DIAGNOSIS — M7751 Other enthesopathy of right foot: Secondary | ICD-10-CM

## 2024-03-09 DIAGNOSIS — Q6671 Congenital pes cavus, right foot: Secondary | ICD-10-CM | POA: Diagnosis not present

## 2024-03-09 NOTE — Progress Notes (Signed)
 Subjective:  Patient ID: Carly Thomas, female    DOB: 12-23-1980,  MRN: [id number]  Chief Complaint  Patient presents with   Foot Pain    Pt stated that she has this place on her right foot that is causing her discomfort when she walks     43 y.o. female presents with the above complaint.  Patient presents with right second metatarsophalangeal joint pain.  Patient states the pain for touch is progressive gotten worse.  Is causing her some discomfort as she walks.  She went to get it evaluated she has not seen and was prior to seeing me she does not wear any orthotics.  She has tried shoe gear modification.  Pain scale is 5 out of 10 dull aching nature   Review of Systems: Negative except as noted in the HPI. Denies N/V/F/Ch.  Past Medical History:  Diagnosis Date   Depression    with 1st pregnancy   Headache(784.0)    migraines with 1st pregnancy   Heartburn in pregnancy     Current Outpatient Medications:    diazepam (VALIUM) 10 MG tablet, 10 mg See admin instructions. Insert 1 tablet into the vagina 1 hour before bedtime as needed for pelvic floor spasm. (Patient not taking: Reported on 11/25/2023), Disp: , Rfl:    famotidine (PEPCID) 40 MG tablet, Take 40 mg by mouth daily as needed for heartburn., Disp: , Rfl:    fenofibrate  160 MG tablet, TAKE 1 TABLET BY MOUTH ONCE DAILY, Disp: 90 tablet, Rfl: 3   nortriptyline (PAMELOR) 50 MG capsule, Take 50 mg by mouth at bedtime., Disp: , Rfl:    VRAYLAR  3 MG capsule, Take 3 mg by mouth at bedtime., Disp: , Rfl:   Social History   Tobacco Use  Smoking Status Never  Smokeless Tobacco Not on file    No Known Allergies Objective:  There were no vitals filed for this visit. There is no height or weight on file to calculate BMI. Constitutional Well developed. Well nourished.  Vascular Dorsalis pedis pulses palpable bilaterally. Posterior tibial pulses palpable bilaterally. Capillary refill normal to all digits.  No cyanosis or  clubbing noted. Pedal hair growth normal.  Neurologic Normal speech. Oriented to person, place, and time. Epicritic sensation to light touch grossly present bilaterally.  Dermatologic Nails well groomed and normal in appearance. No open wounds. No skin lesions.  Orthopedic: Pain on palpation right second metatarsophalangeal joint pain with range of motion of the joint deep intra-articular pain noted.  No crepitus noted.  No pain at the first metatarsophalangeal joint.  No metatarsalgia pain noted   Radiographs: None Assessment:   1. Capsulitis of metatarsophalangeal (MTP) joint of right foot   2. Pes cavus    Plan:  Patient was evaluated and treated and all questions answered.  Right second metatarsophalangeal joint capsulitis - All questions and concerns were discussed with the patient in extensive detail given the amount of pain that she was having should benefit from a steroid injection to help decrease acute inflammatory component/with pain.  Patient agrees with plan like to proceed with steroid injection -A steroid injection was performed at right second metatarsophalangeal joint using 1% plain Lidocaine and 10 mg of Kenalog . This was well tolerated.  Pes cavus -I explained to patient the etiology of pes cavus and relationship with Planter fasciitis/arch and various treatment options were discussed.  Given patient foot structure in the setting of Planter fasciitis/arch I believe patient will benefit from custom-made orthotics to help control  the hindfoot motion support the arch of the foot and take the stress away from plantar fascial.  Patient agrees with the plan like to proceed with orthotics -Patient was casted for orthotics with offloading of second metatarsophalangeal joint

## 2024-04-13 ENCOUNTER — Ambulatory Visit (INDEPENDENT_AMBULATORY_CARE_PROVIDER_SITE_OTHER): Admitting: Podiatry

## 2024-04-13 ENCOUNTER — Other Ambulatory Visit

## 2024-04-13 DIAGNOSIS — M7751 Other enthesopathy of right foot: Secondary | ICD-10-CM

## 2024-04-13 DIAGNOSIS — Q667 Congenital pes cavus, unspecified foot: Secondary | ICD-10-CM

## 2024-04-13 NOTE — Progress Notes (Signed)
 Subjective:  Patient ID: Carly Thomas, female    DOB: Jul 12, 1981,  MRN: [id number]  Chief Complaint  Patient presents with   Foot Pain    Pt stated that she is doing better than she was     43 y.o. female presents with the above complaint.  Patient presents with right second metatarsophalangeal joint pain.  Patient states the pain for touch is progressive gotten worse.  Is causing her some discomfort as she walks.  She went to get it evaluated she has not seen and was prior to seeing me she does not wear any orthotics.  She has tried shoe gear modification.  Pain scale is 5 out of 10 dull aching nature   Review of Systems: Negative except as noted in the HPI. Denies N/V/F/Ch.  Past Medical History:  Diagnosis Date   Depression    with 1st pregnancy   Headache(784.0)    migraines with 1st pregnancy   Heartburn in pregnancy     Current Outpatient Medications:    diazepam (VALIUM) 10 MG tablet, 10 mg See admin instructions. Insert 1 tablet into the vagina 1 hour before bedtime as needed for pelvic floor spasm. (Patient not taking: Reported on 11/25/2023), Disp: , Rfl:    famotidine (PEPCID) 40 MG tablet, Take 40 mg by mouth daily as needed for heartburn., Disp: , Rfl:    fenofibrate  160 MG tablet, TAKE 1 TABLET BY MOUTH ONCE DAILY, Disp: 90 tablet, Rfl: 3   nortriptyline (PAMELOR) 50 MG capsule, Take 50 mg by mouth at bedtime., Disp: , Rfl:    VRAYLAR  3 MG capsule, Take 3 mg by mouth at bedtime., Disp: , Rfl:   Social History   Tobacco Use  Smoking Status Never  Smokeless Tobacco Not on file    No Known Allergies Objective:  There were no vitals filed for this visit. There is no height or weight on file to calculate BMI. Constitutional Well developed. Well nourished.  Vascular Dorsalis pedis pulses palpable bilaterally. Posterior tibial pulses palpable bilaterally. Capillary refill normal to all digits.  No cyanosis or clubbing noted. Pedal hair growth normal.   Neurologic Normal speech. Oriented to person, place, and time. Epicritic sensation to light touch grossly present bilaterally.  Dermatologic Nails well groomed and normal in appearance. No open wounds. No skin lesions.  Orthopedic: Pain on palpation right second metatarsophalangeal joint pain with range of motion of the joint deep intra-articular pain noted.  No crepitus noted.  No pain at the first metatarsophalangeal joint.  No metatarsalgia pain noted   Radiographs: None Assessment:   No diagnosis found.  Plan:  Patient was evaluated and treated and all questions answered.  Right second metatarsophalangeal joint capsulitis - All questions and concerns were discussed with the patient in extensive detail given the amount of pain that she was having should benefit from a steroid injection to help decrease acute inflammatory component/with pain.  Patient agrees with plan like to proceed with steroid injection -A steroid injection was performed at right second metatarsophalangeal joint using 1% plain Lidocaine and 10 mg of Kenalog . This was well tolerated.  Pes cavus -I explained to patient the etiology of pes cavus and relationship with Planter fasciitis/arch and various treatment options were discussed.  Given patient foot structure in the setting of Planter fasciitis/arch I believe patient will benefit from custom-made orthotics to help control the hindfoot motion support the arch of the foot and take the stress away from plantar fascial.  Patient agrees with the  plan like to proceed with orthotics -Patient was casted for orthotics with offloading of second metatarsophalangeal joint

## 2024-04-13 NOTE — Progress Notes (Signed)
  Subjective:  Patient ID: Carly Thomas, female    DOB: [birth year],  MRN: [id number]  Chief Complaint  Patient presents with   Foot Orthotics    43 y.o. female presents with the above complaint.  Patient presents for follow-up of right second metatarsophalangeal joint pain.  She states that they are a lot better injection helped considerably.   Review of Systems: Negative except as noted in the HPI. Denies N/V/F/Ch.  Past Medical History:  Diagnosis Date   Depression    with 1st pregnancy   Headache(784.0)    migraines with 1st pregnancy   Heartburn in pregnancy     Current Outpatient Medications:    diazepam (VALIUM) 10 MG tablet, 10 mg See admin instructions. Insert 1 tablet into the vagina 1 hour before bedtime as needed for pelvic floor spasm. (Patient not taking: Reported on 11/25/2023), Disp: , Rfl:    famotidine (PEPCID) 40 MG tablet, Take 40 mg by mouth daily as needed for heartburn., Disp: , Rfl:    fenofibrate  160 MG tablet, TAKE 1 TABLET BY MOUTH ONCE DAILY, Disp: 90 tablet, Rfl: 3   nortriptyline (PAMELOR) 50 MG capsule, Take 50 mg by mouth at bedtime., Disp: , Rfl:    VRAYLAR  3 MG capsule, Take 3 mg by mouth at bedtime., Disp: , Rfl:   Social History   Tobacco Use  Smoking Status Never  Smokeless Tobacco Not on file    No Known Allergies Objective:  There were no vitals filed for this visit. There is no height or weight on file to calculate BMI. Constitutional Well developed. Well nourished.  Vascular Dorsalis pedis pulses palpable bilaterally. Posterior tibial pulses palpable bilaterally. Capillary refill normal to all digits.  No cyanosis or clubbing noted. Pedal hair growth normal.  Neurologic Normal speech. Oriented to person, place, and time. Epicritic sensation to light touch grossly present bilaterally.  Dermatologic Nails well groomed and normal in appearance. No open wounds. No skin lesions.  Orthopedic: No further pain on palpation right  second metatarsophalangeal joint pain with range of motion of the joint deep intra-articular pain noted.  No crepitus noted.  No pain at the first metatarsophalangeal joint.  No metatarsalgia pain noted   Radiographs: None Assessment:   No diagnosis found.  Plan:  Patient was evaluated and treated and all questions answered.  Right second metatarsophalangeal joint capsulitis - Clinically healed no further injections are indicated.  At this time if any foot and ankle issues on future she will come back and see me I discussed shoe gear modification.  She will continue wear her orthotics  Pes cavus -I explained to patient the etiology of pes cavus and relationship with Planter fasciitis/arch and various treatment options were discussed.  Given patient foot structure in the setting of Planter fasciitis/arch I believe patient will benefit from custom-made orthotics to help control the hindfoot motion support the arch of the foot and take the stress away from plantar fascial.  Patient agrees with the plan like to proceed with orthotics - Orthotics were dispensed functioning well no acute complaints

## 2024-06-14 ENCOUNTER — Other Ambulatory Visit: Payer: Self-pay | Admitting: Family Medicine

## 2024-06-14 DIAGNOSIS — M25551 Pain in right hip: Secondary | ICD-10-CM

## 2024-06-15 ENCOUNTER — Ambulatory Visit
Admission: RE | Admit: 2024-06-15 | Discharge: 2024-06-15 | Disposition: A | Discharge status: Home / Self Care | Source: Ambulatory Visit | Attending: Family Medicine | Admitting: Family Medicine

## 2024-06-15 DIAGNOSIS — M25551 Pain in right hip: Secondary | ICD-10-CM

## 2024-10-27 ENCOUNTER — Telehealth: Admitting: Physician Assistant

## 2024-10-27 DIAGNOSIS — H10022 Other mucopurulent conjunctivitis, left eye: Secondary | ICD-10-CM

## 2024-10-27 MED ORDER — POLYMYXIN B-TRIMETHOPRIM 10000-0.1 UNIT/ML-% OP SOLN: 1.0000 [drp] | Freq: Four times a day (QID) | OPHTHALMIC | 0 refills | Status: AC

## 2024-10-27 NOTE — Progress Notes (Signed)
 E-Visit for [employer]   We are sorry that you are not feeling well.  Here is how we plan to help!  Based on what you have shared with me it looks like you have conjunctivitis.  Conjunctivitis is a common inflammatory or infectious condition of the eye that is often referred to as pink eye.  In most cases it is contagious (viral or bacterial). However, not all conjunctivitis requires antibiotics (ex. Allergic).  We have made appropriate suggestions for you based upon your presentation.  I have prescribed Polytrim Ophthalmic drops 1-2 drops 4 times a day times 5 days  Pink eye can be highly contagious.  It is typically spread through direct contact with secretions, or contaminated objects or surfaces that one may have touched.  Strict handwashing is suggested with soap and water is urged.  If not available, use alcohol based had sanitizer.  Avoid unnecessary touching of the eye.  If you wear contact lenses, you will need to refrain from wearing them until you see no white discharge from the eye for at least 24 hours after being on medication.  You should see symptom improvement in 1-2 days after starting the medication regimen.  Call us  if symptoms are not improved in 1-2 days.  Home Care: Wash your hands often! Do not wear your contacts until you complete your treatment plan. Avoid sharing towels, bed linen, personal items with a person who has pink eye. See attention for anyone in your home with similar symptoms.  Get Help Right Away If: Your symptoms do not improve. You develop blurred or loss of vision. Your symptoms worsen (increased discharge, pain or redness)   Thank you for choosing an e-visit.  Your e-visit answers were reviewed by a board certified advanced clinical practitioner to complete your personal care plan. Depending upon the condition, your plan could have included both over the counter or prescription medications.  Please review your pharmacy choice. Make sure the  pharmacy is open so you can pick up prescription now. If there is a problem, you may contact your provider through [employer] and have the prescription routed to another pharmacy.  Your safety is important to us . If you have drug allergies check your prescription carefully.   For the next 24 hours you can use MyChart to ask questions about today's visit, request a non-urgent call back, or ask for a work or school excuse. You will get an email in the next two days asking about your experience. I hope that your e-visit has been valuable and will speed your recovery.  I have spent 5 minutes in review of e-visit questionnaire, review and updating patient chart, medical decision making and response to patient.   Chiquita CHRISTELLA Barefoot, NP
